# Patient Record
Sex: Male | Born: 1985 | Race: White | Hispanic: No | Smoking: Never smoker
Health system: Southern US, Community
[De-identification: ages and names within clinical notes are randomized; demographics above are authoritative.]

## PROBLEM LIST (undated history)

## (undated) DIAGNOSIS — J302 Other seasonal allergic rhinitis: Secondary | ICD-10-CM

## (undated) HISTORY — PX: NO PAST SURGERIES: SHX2092

## (undated) HISTORY — DX: Other seasonal allergic rhinitis: J30.2

---

## 2014-08-01 ENCOUNTER — Emergency Department (INDEPENDENT_AMBULATORY_CARE_PROVIDER_SITE_OTHER): Payer: 59

## 2014-08-01 ENCOUNTER — Encounter (HOSPITAL_COMMUNITY): Payer: Self-pay | Admitting: *Deleted

## 2014-08-01 ENCOUNTER — Emergency Department (INDEPENDENT_AMBULATORY_CARE_PROVIDER_SITE_OTHER)
Admission: EM | Admit: 2014-08-01 | Discharge: 2014-08-01 | Disposition: A | Discharge status: Home / Self Care | Payer: 59 | Source: Home / Self Care | Attending: Family Medicine | Admitting: Family Medicine

## 2014-08-01 DIAGNOSIS — R52 Pain, unspecified: Secondary | ICD-10-CM

## 2014-08-01 DIAGNOSIS — M79671 Pain in right foot: Secondary | ICD-10-CM

## 2014-08-01 MED ORDER — KETOROLAC TROMETHAMINE 30 MG/ML IJ SOLN
INTRAMUSCULAR | Status: AC
  Filled 2014-08-01: qty 1

## 2014-08-01 MED ORDER — KETOROLAC TROMETHAMINE 30 MG/ML IJ SOLN
30.0000 mg | Freq: Once | INTRAMUSCULAR | Status: AC
  Administered 2014-08-01: 30 mg via INTRAMUSCULAR

## 2014-08-01 NOTE — ED Provider Notes (Signed)
CSN: [id number]     Arrival date & time 08/01/14  1716 History   First MD Initiated Contact with Patient 08/01/14 1734     Chief Complaint  Patient presents with  . Foot Pain   (Consider location/radiation/quality/duration/timing/severity/associated sxs/prior Treatment)  HPI   Patient is a 28 year old male presenting today with complaints of right foot pain following running a full marathon this past Saturday. Patient states he runs 10-15 miles 2-3 times a week without difficulty. Patient admits to "not wearing the best shoes". States that his foot felt "okay" on Saturday, but he started to have lower foot pain causing him to "hobble" the ;past few days.  Denies any bruising or swelling.  No obvious injury.  Denies any medical history, takes no medications, no known drug allergies.  History reviewed. No pertinent past medical history. History reviewed. No pertinent past surgical history. History reviewed. No pertinent family history. History  Substance Use Topics  . Smoking status: Never Smoker   . Smokeless tobacco: Not on file  . Alcohol Use: Yes    Review of Systems  Constitutional: Negative.  Negative for fever and fatigue.  HENT: Negative.   Eyes: Negative.   Respiratory: Negative.   Cardiovascular: Negative.   Gastrointestinal: Negative.   Endocrine: Negative.   Genitourinary: Negative.   Musculoskeletal: Positive for gait problem.       R foot pain.  Skin: Negative.  Negative for color change and wound.  Allergic/Immunologic: Negative.   Neurological: Negative.  Negative for weakness and numbness.  Hematological: Negative.   Psychiatric/Behavioral: Negative.     Allergies  Review of patient's allergies indicates not on file.  Home Medications   Prior to Admission medications   Not on File   BP 131/81 mmHg  Pulse 60  Temp(Src) 98.1 F (36.7 C) (Oral)  Resp 12  SpO2 99%   Physical Exam  Constitutional: He is oriented to person, place, and time. He  appears well-developed and well-nourished. No distress.  Cardiovascular: Normal rate, regular rhythm, normal heart sounds and intact distal pulses.  Exam reveals no gallop and no friction rub.   No murmur heard. Pulmonary/Chest: Effort normal and breath sounds normal. No respiratory distress. He has no wheezes. He has no rales. He exhibits no tenderness.  Musculoskeletal: Normal range of motion. He exhibits no edema or tenderness.       Feet:  The patient is able to ambulate and bear weight however reports discomfort.  There is no obvious asymmetry or deformity of the right foot as compared to the left foot. No surface trauma, ecchymosis, erythema, lesions, ulcers or break in skin integrity. No bony step-off or deformity, no tenderness to palpation of his toes, midfoot or hindfoot or sole. Normal plantar/dorsiflexion, inversion/eversion. Distal motor neurovascular status is intact.  Neurological: He is alert and oriented to person, place, and time. He exhibits normal muscle tone. Coordination normal.  Skin: Skin is warm and dry. No rash noted. He is not diaphoretic. No erythema. No pallor.  Nursing note and vitals reviewed.  ED Course  Procedures (including critical care time) Labs Review Labs Reviewed - No data to display  Imaging Review Dg Foot Complete Right  08/01/2014   CLINICAL DATA:  Fifth metatarsal pain when walking, was running marathon 3 days ago  EXAM: RIGHT FOOT COMPLETE - 3+ VIEW  COMPARISON:  None.  FINDINGS: Three views of the right foot submitted. No acute fracture or subluxation. No radiopaque foreign body. No periosteal reaction or bony erosion.  IMPRESSION:  Negative.   Electronically Signed   By: Natasha MeadLiviu  Pop M.D.   On: 08/01/2014 18:34           MDM   1. Pain    Meds ordered this encounter  Medications  . ketorolac (TORADOL) 30 MG/ML injection 30 mg    Sig:    Differentials include overuse strain and stress fractures.  The patient to wear a cam walker boot  on the right foot for the next week. No weightbearing exercises. If symptoms worsen or fail to improve within the next week, the patient to follow up with Dr. Linna HoffNormal Regal with [facility].  The patient is to take Aleve over-the-counter twice daily in the morning twice in the evening for the next week.  The patient verbalizes understanding and agrees to plan of care.     Servando Salinaatherine H Rossi, NP 08/01/14 1853  Servando Salinaatherine H Rossi, NP 08/01/14 1858

## 2014-08-01 NOTE — ED Notes (Signed)
Pt            Reports  He       Ran  A  Marathon   sev  Days  Ago    And  Subsequently  developed  Pain    r  Foot    He  denys  A  specefic injury           He has  No  Obvious  Deformity

## 2014-08-01 NOTE — Discharge Instructions (Signed)
Foot Sprain The muscles and cord like structures which attach muscle to bone (tendons) that surround the feet are made up of units. A foot sprain can occur at the weakest spot in any of these units. This condition is most often caused by injury to or overuse of the foot, as from playing contact sports, or aggravating a previous injury, or from poor conditioning, or obesity. SYMPTOMS  Pain with movement of the foot.  Tenderness and swelling at the injury site.  Loss of strength is present in moderate or severe sprains. THE THREE GRADES OR SEVERITY OF FOOT SPRAIN ARE:  Mild (Grade I): Slightly pulled muscle without tearing of muscle or tendon fibers or loss of strength.  Moderate (Grade II): Tearing of fibers in a muscle, tendon, or at the attachment to bone, with small decrease in strength.  Severe (Grade III): Rupture of the muscle-tendon-bone attachment, with separation of fibers. Severe sprain requires surgical repair. Often repeating (chronic) sprains are caused by overuse. Sudden (acute) sprains are caused by direct injury or over-use. DIAGNOSIS  Diagnosis of this condition is usually by your own observation. If problems continue, a caregiver may be required for further evaluation and treatment. X-rays may be required to make sure there are not breaks in the bones (fractures) present. Continued problems may require physical therapy for treatment. PREVENTION  Use strength and conditioning exercises appropriate for your sport.  Warm up properly prior to working out.  Use athletic shoes that are made for the sport you are participating in.  Allow adequate time for healing. Early return to activities makes repeat injury more likely, and can lead to an unstable arthritic foot that can result in prolonged disability. Mild sprains generally heal in 3 to 10 days, with moderate and severe sprains taking 2 to 10 weeks. Your caregiver can help you determine the proper time required for  healing. HOME CARE INSTRUCTIONS   Apply ice to the injury for 15-20 minutes, 03-04 times per day. Put the ice in a plastic bag and place a towel between the bag of ice and your skin.  An elastic wrap (like an Ace bandage) may be used to keep swelling down.  Keep foot above the level of the heart, or at least raised on a footstool, when swelling and pain are present.  Try to avoid use other than gentle range of motion while the foot is painful. Do not resume use until instructed by your caregiver. Then begin use gradually, not increasing use to the point of pain. If pain does develop, decrease use and continue the above measures, gradually increasing activities that do not cause discomfort, until you gradually achieve normal use.  Use crutches if and as instructed, and for the length of time instructed.  Keep injured foot and ankle wrapped between treatments.  Massage foot and ankle for comfort and to keep swelling down. Massage from the toes up towards the knee.  Only take over-the-counter or prescription medicines for pain, discomfort, or fever as directed by your caregiver. SEEK IMMEDIATE MEDICAL CARE IF:   Your pain and swelling increase, or pain is not controlled with medications.  You have loss of feeling in your foot or your foot turns cold or blue.  You develop new, unexplained symptoms, or an increase of the symptoms that brought you to your caregiver. MAKE SURE YOU:   Understand these instructions.  Will watch your condition.  Will get help right away if you are not doing well or get worse. Document Released:   02/28/2002 Document Revised: 12/01/2011 Document Reviewed: 04/27/2008 [employer] Patient Information 2015 [employer]. This information is not intended to replace advice given to you by your health care provider. Make sure you discuss any questions you have with your health care provider.  

## 2014-08-30 ENCOUNTER — Ambulatory Visit (INDEPENDENT_AMBULATORY_CARE_PROVIDER_SITE_OTHER): Payer: 59 | Admitting: Family Medicine

## 2014-08-30 ENCOUNTER — Encounter: Payer: Self-pay | Admitting: Family Medicine

## 2014-08-30 VITALS — BP 124/84 | HR 60 | Temp 97.8°F | Ht 73.0 in | Wt 212.8 lb

## 2014-08-30 DIAGNOSIS — B360 Pityriasis versicolor: Secondary | ICD-10-CM | POA: Insufficient documentation

## 2014-08-30 DIAGNOSIS — R1031 Right lower quadrant pain: Secondary | ICD-10-CM | POA: Insufficient documentation

## 2014-08-30 DIAGNOSIS — M940 Chondrocostal junction syndrome [Tietze]: Secondary | ICD-10-CM | POA: Insufficient documentation

## 2014-08-30 DIAGNOSIS — Z0000 Encounter for general adult medical examination without abnormal findings: Secondary | ICD-10-CM | POA: Insufficient documentation

## 2014-08-30 LAB — LIPID PANEL
CHOLESTEROL: 199 mg/dL (ref 0–200)
HDL: 51.7 mg/dL (ref >39.00)
LDL CALC: 138 mg/dL — AB (ref 0–99)
NonHDL: 147.3
TRIGLYCERIDES: 48 mg/dL (ref 0.0–149.0)
Total CHOL/HDL Ratio: 4
VLDL: 9.6 mg/dL (ref 0.0–40.0)

## 2014-08-30 LAB — COMPREHENSIVE METABOLIC PANEL
ALBUMIN: 4.6 g/dL (ref 3.5–5.2)
ALK PHOS: 50 U/L (ref 39–117)
ALT: 25 U/L (ref 0–53)
AST: 37 U/L (ref 0–37)
BILIRUBIN TOTAL: 1.4 mg/dL — AB (ref 0.2–1.2)
BUN: 18 mg/dL (ref 6–23)
CO2: 30 mEq/L (ref 19–32)
Calcium: 9 mg/dL (ref 8.4–10.5)
Chloride: 99 mEq/L (ref 96–112)
Creatinine, Ser: 1.1 mg/dL (ref 0.4–1.5)
GFR: 87.27 mL/min (ref >60.00)
Glucose, Bld: 87 mg/dL (ref 70–99)
Potassium: 4 mEq/L (ref 3.5–5.1)
Sodium: 135 mEq/L (ref 135–145)
Total Protein: 7.9 g/dL (ref 6.0–8.3)

## 2014-08-30 MED ORDER — FLUCONAZOLE 150 MG PO TABS: 300.0000 mg | ORAL_TABLET | ORAL | Status: DC

## 2014-08-30 NOTE — Progress Notes (Signed)
BP 124/84 mmHg  Pulse 60  Temp(Src) 97.8 F (36.6 C) (Oral)  Ht 6\' 1"  (1.854 m)  Wt 212 lb 12 oz (96.503 kg)  BMI 28.08 kg/m2   CC: new pt to establish, would like CPE Subjective:    Patient ID: Aaron Pugh, male    DOB: 01-23-1986, 28 y.o.   MRN: [id number]  HPI: Aaron RidgeSean Stormes is a 28 y.o. male presenting on 08/30/2014 for Establish Care   Would like CPE today.  [place]  Seasonal allergies controlled with zyrtec.  Intermittent RLQ discomfort over last 2-3 years. May have started after heavy lifting. Ran marathon last month, and more noticeable during training. Stays fit. Has started participating crossfit.  After marathon had ankle sprain - [facility]. Now resolved.  Spots on shoulder worse 2 yrs ago, treated with OTC antifungal.  Preventative: No recent CPE Flu shot - at work Tetanus - 2006 Seat belt use discussed Sunscreen use discussed - doesn't wear. Would like skin lesions on shoulder checked. Declines STD screen today.  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 Interim medical history since our last visit reviewed. Allergies and medications reviewed and updated.  No current outpatient prescriptions on file prior to visit.   No current facility-administered medications on file prior to visit.    Review of Systems  Constitutional: Negative for fever, chills, activity change, appetite change, fatigue and unexpected weight change.  HENT: Negative for hearing loss.   Eyes: Negative for visual disturbance.  Respiratory: Negative for cough, chest tightness, shortness of breath and wheezing.   Cardiovascular: Negative for chest pain, palpitations and leg  swelling.  Gastrointestinal: Positive for abdominal pain (RLQ discomfort). Negative for nausea, vomiting, diarrhea, constipation, blood in stool and abdominal distention.  Genitourinary: Negative for dysuria, hematuria and difficulty urinating.  Musculoskeletal: Negative for myalgias, arthralgias and neck pain.       Chest wall pain  Skin: Negative for rash.  Neurological: Negative for dizziness, seizures, syncope and headaches.  Hematological: Negative for adenopathy. Does not bruise/bleed easily.  Psychiatric/Behavioral: Negative for dysphoric mood. The patient is not nervous/anxious.    Per HPI unless specifically indicated above     Objective:    BP 124/84 mmHg  Pulse 60  Temp(Src) 97.8 F (36.6 C) (Oral)  Ht 6\' 1"  (1.854 m)  Wt 212 lb 12 oz (96.503 kg)  BMI 28.08 kg/m2  Wt Readings from Last 3 Encounters:  08/30/14 212 lb 12 oz (96.503 kg)    Physical Exam  Constitutional: He is oriented to person, place, and time. He appears well-developed and well-nourished. No distress.  Muscular build  HENT:  Head: Normocephalic and atraumatic.  Right Ear: Hearing, tympanic membrane, external ear and ear canal normal.  Left Ear: Hearing, tympanic membrane, external ear and ear canal normal.  Nose: Nose normal.  Mouth/Throat: Uvula is midline, oropharynx is clear and moist and mucous membranes are normal. No oropharyngeal exudate, posterior oropharyngeal edema or posterior oropharyngeal erythema.  Eyes: Conjunctivae and EOM are normal. Pupils are equal, round, and reactive to light. No scleral icterus.  Neck: Normal range of motion. Neck supple. No thyromegaly present.  Cardiovascular: Normal rate, regular rhythm, normal heart sounds and intact distal pulses.  No murmur heard. Pulses:      Radial pulses are 2+ on the right side, and 2+ on the left side.  Pulmonary/Chest: Effort normal and breath sounds normal. No respiratory distress. He has no wheezes. He has no rales. He exhibits  tenderness (R 2nd intercostal tenderness to palpation.).  Abdominal: Soft. Normal appearance and bowel sounds are normal. He exhibits no distension and no mass. There is no hepatosplenomegaly. There is no tenderness. There is no rigidity, no rebound, no guarding and negative Murphy's sign. No hernia.  No obvious hernia noted today but there is slight bulge on right with cough compared to left side  Musculoskeletal: Normal range of motion. He exhibits no edema.  Lymphadenopathy:    He has no cervical adenopathy.  Neurological: He is alert and oriented to person, place, and time.  CN grossly intact, station and gait intact  Skin: Skin is warm and dry. Rash noted.  macular rash on upper back and posterior shoulders  Psychiatric: He has a normal mood and affect. His behavior is normal. Judgment and thought content normal.  Nursing note and vitals reviewed.  No results found for this or any previous visit.    Assessment & Plan:   Problem List Items Addressed This Visit    Tinea versicolor    Treat with diflucan course and discussed nizoral or selsun blue preventative.    RLQ discomfort    No obvious hernia on exam today. ?sportsman hernia. Discussed straining and avoiding significant heavy lifting.    Health maintenance examination - Primary    Preventative protocols reviewed and updated unless pt declined. Discussed healthy diet and lifestyle.     Relevant Orders      Lipid panel      Comprehensive metabolic panel   Costochondritis    Discussed dx and managment.        Follow up plan: Return in about 1 year (around 08/31/2015), or as needed, for annual exam, prior fasting for blood work.

## 2014-08-30 NOTE — Assessment & Plan Note (Signed)
Discussed dx and managment.

## 2014-08-30 NOTE — Assessment & Plan Note (Signed)
No obvious hernia on exam today. ?sportsman hernia. Discussed straining and avoiding significant heavy lifting.

## 2014-08-30 NOTE — Assessment & Plan Note (Signed)
Preventative protocols reviewed and updated unless pt declined. Discussed healthy diet and lifestyle.  

## 2014-08-30 NOTE — Assessment & Plan Note (Signed)
Treat with diflucan course and discussed nizoral or selsun blue preventative.

## 2014-08-30 NOTE — Progress Notes (Signed)
Pre visit review using our clinic review tool, if applicable. No additional management support is needed unless otherwise documented below in the visit note. 

## 2014-08-30 NOTE — Patient Instructions (Addendum)
Good to meet you today, call us with questions. Blood work today. For costochondritis - may use ibuprofen as needed. For back - may use antifungal for 2 wk course. Sent to pharmacy. Afterwards use selsun blue or nizoral shampoo once a week on affected skin to prevent recurrence.

## 2015-05-16 ENCOUNTER — Encounter: Payer: Self-pay | Admitting: Family Medicine

## 2015-05-16 ENCOUNTER — Ambulatory Visit (INDEPENDENT_AMBULATORY_CARE_PROVIDER_SITE_OTHER): Payer: Managed Care, Other (non HMO) | Admitting: Family Medicine

## 2015-05-16 VITALS — BP 104/68 | HR 86 | Temp 97.9°F | Wt 213.0 lb

## 2015-05-16 DIAGNOSIS — Z23 Encounter for immunization: Secondary | ICD-10-CM

## 2015-05-16 DIAGNOSIS — Z0000 Encounter for general adult medical examination without abnormal findings: Secondary | ICD-10-CM | POA: Diagnosis not present

## 2015-05-16 DIAGNOSIS — B360 Pityriasis versicolor: Secondary | ICD-10-CM

## 2015-05-16 LAB — LIPID PANEL
CHOLESTEROL: 185 mg/dL (ref 0–200)
HDL: 55.3 mg/dL (ref >39.00)
LDL CALC: 113 mg/dL — AB (ref 0–99)
NonHDL: 129.41
TRIGLYCERIDES: 82 mg/dL (ref 0.0–149.0)
Total CHOL/HDL Ratio: 3
VLDL: 16.4 mg/dL (ref 0.0–40.0)

## 2015-05-16 LAB — BASIC METABOLIC PANEL
BUN: 12 mg/dL (ref 6–23)
CALCIUM: 9.7 mg/dL (ref 8.4–10.5)
CO2: 32 mEq/L (ref 19–32)
Chloride: 102 mEq/L (ref 96–112)
Creatinine, Ser: 1.04 mg/dL (ref 0.40–1.50)
GFR: 89.74 mL/min (ref >60.00)
GLUCOSE: 93 mg/dL (ref 70–99)
Potassium: 4.8 mEq/L (ref 3.5–5.1)
Sodium: 139 mEq/L (ref 135–145)

## 2015-05-16 NOTE — Patient Instructions (Addendum)
Tdap today. Good to see you today, call us with questions. labwork today - we will let you know results.   Health Maintenance A healthy lifestyle and preventative care can promote health and wellness.  Maintain regular health, dental, and eye exams.  Eat a healthy diet. Foods like vegetables, fruits, whole grains, low-fat dairy products, and lean protein foods contain the nutrients you need and are low in calories. Decrease your intake of foods high in solid fats, added sugars, and salt. Get information about a proper diet from your health care provider, if necessary.  Regular physical exercise is one of the most important things you can do for your health. Most adults should get at least 150 minutes of moderate-intensity exercise (any activity that increases your heart rate and causes you to sweat) each week. In addition, most adults need muscle-strengthening exercises on 2 or more days a week.   Maintain a healthy weight. The body mass index (BMI) is a screening tool to identify possible weight problems. It provides an estimate of body fat based on height and weight. Your health care provider can find your BMI and can help you achieve or maintain a healthy weight. For males 20 years and older:  A BMI below 18.5 is considered underweight.  A BMI of 18.5 to 24.9 is normal.  A BMI of 25 to 29.9 is considered overweight.  A BMI of 30 and above is considered obese.  Maintain normal blood lipids and cholesterol by exercising and minimizing your intake of saturated fat. Eat a balanced diet with plenty of fruits and vegetables. Blood tests for lipids and cholesterol should begin at age 39 and be repeated every 5 years. If your lipid or cholesterol levels are high, you are over age 1, or you are at high risk for heart disease, you may need your cholesterol levels checked more frequently.Ongoing high lipid and cholesterol levels should be treated with medicines if diet and exercise are not  working.  If you smoke, find out from your health care provider how to quit. If you do not use tobacco, do not start.  Lung cancer screening is recommended for adults aged 55-80 years who are at high risk for developing lung cancer because of a history of smoking. A yearly low-dose CT scan of the lungs is recommended for people who have at least a 30-pack-year history of smoking and are current smokers or have quit within the past 15 years. A pack year of smoking is smoking an average of 1 pack of cigarettes a day for 1 year (for example, a 30-pack-year history of smoking could mean smoking 1 pack a day for 30 years or 2 packs a day for 15 years). Yearly screening should continue until the smoker has stopped smoking for at least 15 years. Yearly screening should be stopped for people who develop a health problem that would prevent them from having lung cancer treatment.  If you choose to drink alcohol, do not have more than 2 drinks per day. One drink is considered to be 12 oz (360 mL) of beer, 5 oz (150 mL) of wine, or 1.5 oz (45 mL) of liquor.  Avoid the use of street drugs. Do not share needles with anyone. Ask for help if you need support or instructions about stopping the use of drugs.  High blood pressure causes heart disease and increases the risk of stroke. Blood pressure should be checked at least every 1-2 years. Ongoing high blood pressure should be treated with  medicines if weight loss and exercise are not effective.  If you are 19-71 years old, ask your health care provider if you should take aspirin to prevent heart disease.  Diabetes screening involves taking a blood sample to check your fasting blood sugar level. This should be done once every 3 years after age 25 if you are at a normal weight and without risk factors for diabetes. Testing should be considered at a younger age or be carried out more frequently if you are overweight and have at least 1 risk factor for  diabetes.  Colorectal cancer can be detected and often prevented. Most routine colorectal cancer screening begins at the age of 38 and continues through age 74. However, your health care provider may recommend screening at an earlier age if you have risk factors for colon cancer. On a yearly basis, your health care provider may provide home test kits to check for hidden blood in the stool. A small camera at the end of a tube may be used to directly examine the colon (sigmoidoscopy or colonoscopy) to detect the earliest forms of colorectal cancer. Talk to your health care provider about this at age 77 when routine screening begins. A direct exam of the colon should be repeated every 5-10 years through age 70, unless early forms of precancerous polyps or small growths are found.  People who are at an increased risk for hepatitis B should be screened for this virus. You are considered at high risk for hepatitis B if:  You were born in a country where hepatitis B occurs often. Talk with your health care provider about which countries are considered high risk.  Your parents were born in a high-risk country and you have not received a shot to protect against hepatitis B (hepatitis B vaccine).  You have HIV or AIDS.  You use needles to inject street drugs.  You live with, or have sex with, someone who has hepatitis B.  You are a man who has sex with other men (MSM).  You get hemodialysis treatment.  You take certain medicines for conditions like cancer, organ transplantation, and autoimmune conditions.  Hepatitis C blood testing is recommended for all people born from [birth year] through 1965 and any individual with known risk factors for hepatitis C.  Healthy men should no longer receive prostate-specific antigen (PSA) blood tests as part of routine cancer screening. Talk to your health care provider about prostate cancer screening.  Testicular cancer screening is not recommended for adolescents or  adult males who have no symptoms. Screening includes self-exam, a health care provider exam, and other screening tests. Consult with your health care provider about any symptoms you have or any concerns you have about testicular cancer.  Practice safe sex. Use condoms and avoid high-risk sexual practices to reduce the spread of sexually transmitted infections (STIs).  You should be screened for STIs, including gonorrhea and chlamydia if:  You are sexually active and are younger than 24 years.  You are older than 24 years, and your health care provider tells you that you are at risk for this type of infection.  Your sexual activity has changed since you were last screened, and you are at an increased risk for chlamydia or gonorrhea. Ask your health care provider if you are at risk.  If you are at risk of being infected with HIV, it is recommended that you take a prescription medicine daily to prevent HIV infection. This is called pre-exposure prophylaxis (PrEP). You are considered  at risk if:  You are a man who has sex with other men (MSM).  You are a heterosexual man who is sexually active with multiple partners.  You take drugs by injection.  You are sexually active with a partner who has HIV.  Talk with your health care provider about whether you are at high risk of being infected with HIV. If you choose to begin PrEP, you should first be tested for HIV. You should then be tested every 3 months for as long as you are taking PrEP.  Use sunscreen. Apply sunscreen liberally and repeatedly throughout the day. You should seek shade when your shadow is shorter than you. Protect yourself by wearing long sleeves, pants, a wide-brimmed hat, and sunglasses year round whenever you are outdoors.  Tell your health care provider of new moles or changes in moles, especially if there is a change in shape or color. Also, tell your health care provider if a mole is larger than the size of a pencil  eraser.  A one-time screening for abdominal aortic aneurysm (AAA) and surgical repair of large AAAs by ultrasound is recommended for men aged 38-75 years who are current or former smokers.  Stay current with your vaccines (immunizations). Document Released: 03/06/2008 Document Revised: 09/13/2013 Document Reviewed: 02/03/2011 [facility] Patient Information 2015 [place], [place]. This information is not intended to replace advice given to you by your health care provider. Make sure you discuss any questions you have with your health care provider.

## 2015-05-16 NOTE — Addendum Note (Signed)
Addended byCleda Clarks B on: 05/16/2015 09:52 AM   Modules accepted: Orders

## 2015-05-16 NOTE — Progress Notes (Signed)
BP 104/68 mmHg  Pulse 86  Temp(Src) 97.9 F (36.6 C) (Oral)  Wt 213 lb (96.616 kg)  SpO2 99%   CC: biometric screening  Subjective:    Patient ID: Aaron Pugh, male    DOB: 04-07-1986, 29 y.o.   MRN: [id number]  HPI: Aaron Pugh is a 29 y.o. male presenting on 05/16/2015 for Acute Visit   Wife pregnant.  New job - [employer] - 6wks ago. Last CPE 08/2014. Labs don't qualify for work form as done 08/2014 and needs labs since 09/2014. Needs new CPE today.  Tinea versicolor - diflucan helped. Has not been regular with antifungal shampoo.  Preventative: Flu shot - at work Tetanus - 2006. Tdap today. Seat belt use discussed Sunscreen use discussed - doesn't wear. Would like skin lesions on shoulder checked.   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 Interim medical history since our last visit reviewed. Allergies and medications reviewed and updated. Current Outpatient Prescriptions on File Prior to Visit  Medication Sig  . fluconazole (DIFLUCAN) 150 MG tablet Take 2 tablets (300 mg total) by mouth once a week. (Patient not taking: Reported on 05/16/2015)   No current facility-administered medications on file prior to visit.    Review of Systems  Constitutional: Negative for fever, chills, activity change, appetite change, fatigue and unexpected weight change.  HENT: Negative for hearing loss.   Eyes: Negative for visual disturbance.  Respiratory: Negative for cough, chest tightness, shortness of breath and wheezing.   Cardiovascular: Negative for chest pain, palpitations and leg swelling.  Gastrointestinal: Negative for nausea, vomiting, abdominal pain, diarrhea, constipation, blood in stool and abdominal distention.    Genitourinary: Negative for hematuria and difficulty urinating.  Musculoskeletal: Negative for myalgias, arthralgias and neck pain.  Skin: Negative for rash.  Neurological: Negative for dizziness, seizures, syncope and headaches.  Hematological: Negative for adenopathy. Does not bruise/bleed easily.  Psychiatric/Behavioral: Negative for dysphoric mood. The patient is not nervous/anxious.    Per HPI unless specifically indicated above     Objective:    BP 104/68 mmHg  Pulse 86  Temp(Src) 97.9 F (36.6 C) (Oral)  Wt 213 lb (96.616 kg)  SpO2 99%  Wt Readings from Last 3 Encounters:  05/16/15 213 lb (96.616 kg)  08/30/14 212 lb 12 oz (96.503 kg)   Body mass index is 28.11 kg/(m^2).   Physical Exam  Constitutional: He is oriented to person, place, and time. He appears well-developed and well-nourished. No distress.  muscular  HENT:  Head: Normocephalic and atraumatic.  Right Ear: Hearing, tympanic membrane, external ear and ear canal normal.  Left Ear: Hearing, tympanic membrane, external ear and ear canal normal.  Nose: Nose normal.  Mouth/Throat: Uvula is midline, oropharynx is clear and moist and mucous membranes are normal. No oropharyngeal exudate, posterior oropharyngeal edema or posterior oropharyngeal erythema.  Eyes: Conjunctivae and EOM are normal. Pupils are equal, round, and reactive to light. No scleral icterus.  Neck: Normal range of motion. Neck supple. No thyromegaly present.  Cardiovascular: Normal rate, regular rhythm, normal heart sounds and intact distal pulses.   No murmur heard. Pulses:      Radial pulses are 2+ on the right side, and 2+ on the left side.  Pulmonary/Chest: Effort normal and breath sounds normal. No respiratory distress.  He has no wheezes. He has no rales.  Abdominal: Soft. Bowel sounds are normal. He exhibits no distension and no mass. There is no tenderness. There is no rebound and no guarding.  Musculoskeletal: Normal range of motion. He  exhibits no edema.  Lymphadenopathy:    He has no cervical adenopathy.  Neurological: He is alert and oriented to person, place, and time.  CN grossly intact, station and gait intact  Skin: Skin is warm and dry. Rash noted.  Macular hypopigmented rash on back.  Psychiatric: He has a normal mood and affect. His behavior is normal. Judgment and thought content normal.  Nursing note and vitals reviewed.  Results for orders placed or performed in visit on 08/30/14  Lipid panel  Result Value Ref Range   Cholesterol 199 0 - 200 mg/dL   Triglycerides 16.1 0.0 - 149.0 mg/dL   HDL 09.60 >45.40 mg/dL   VLDL 9.6 0.0 - 98.1 mg/dL   LDL Cholesterol 191 (H) 0 - 99 mg/dL   Total CHOL/HDL Ratio 4    NonHDL 147.30   Comprehensive metabolic panel  Result Value Ref Range   Sodium 135 135 - 145 mEq/L   Potassium 4.0 3.5 - 5.1 mEq/L   Chloride 99 96 - 112 mEq/L   CO2 30 19 - 32 mEq/L   Glucose, Bld 87 70 - 99 mg/dL   BUN 18 6 - 23 mg/dL   Creatinine, Ser 1.1 0.4 - 1.5 mg/dL   Total Bilirubin 1.4 (H) 0.2 - 1.2 mg/dL   Alkaline Phosphatase 50 39 - 117 U/L   AST 37 0 - 37 U/L   ALT 25 0 - 53 U/L   Total Protein 7.9 6.0 - 8.3 g/dL   Albumin 4.6 3.5 - 5.2 g/dL   Calcium 9.0 8.4 - 47.8 mg/dL   GFR 29.56 >21.30 mL/min      Assessment & Plan:   Problem List Items Addressed This Visit    Health maintenance examination - Primary    Preventative protocols reviewed and updated unless pt declined. Discussed healthy diet and lifestyle.  Update labs for work.      Relevant Orders   Lipid panel   Basic metabolic panel   Tinea versicolor    Diflucan helped, has not been regular with antifungal shampoo for maintenance. Will try this, update if desires refill of oral antifungal in the future.          Follow up plan: Return in about 1 year (around 05/15/2016) for annual exam, prior fasting for blood work.

## 2015-05-16 NOTE — Assessment & Plan Note (Signed)
Preventative protocols reviewed and updated unless pt declined. Discussed healthy diet and lifestyle.  Update labs for work.

## 2015-05-16 NOTE — Progress Notes (Signed)
Pre visit review using our clinic review tool, if applicable. No additional management support is needed unless otherwise documented below in the visit note. 

## 2015-05-16 NOTE — Assessment & Plan Note (Signed)
Diflucan helped, has not been regular with antifungal shampoo for maintenance. Will try this, update if desires refill of oral antifungal in the future.

## 2015-08-31 ENCOUNTER — Other Ambulatory Visit: Payer: 59

## 2015-09-03 ENCOUNTER — Encounter: Payer: 59 | Admitting: Family Medicine

## 2016-05-19 ENCOUNTER — Ambulatory Visit (INDEPENDENT_AMBULATORY_CARE_PROVIDER_SITE_OTHER): Payer: Managed Care, Other (non HMO) | Admitting: Family Medicine

## 2016-05-19 ENCOUNTER — Encounter: Payer: Self-pay | Admitting: Family Medicine

## 2016-05-19 VITALS — BP 116/72 | HR 68 | Temp 97.6°F | Ht 73.0 in | Wt 219.5 lb

## 2016-05-19 DIAGNOSIS — Z131 Encounter for screening for diabetes mellitus: Secondary | ICD-10-CM

## 2016-05-19 DIAGNOSIS — Z13220 Encounter for screening for lipoid disorders: Secondary | ICD-10-CM

## 2016-05-19 DIAGNOSIS — Z23 Encounter for immunization: Secondary | ICD-10-CM

## 2016-05-19 DIAGNOSIS — Z0000 Encounter for general adult medical examination without abnormal findings: Secondary | ICD-10-CM | POA: Diagnosis not present

## 2016-05-19 DIAGNOSIS — L84 Corns and callosities: Secondary | ICD-10-CM

## 2016-05-19 DIAGNOSIS — B360 Pityriasis versicolor: Secondary | ICD-10-CM

## 2016-05-19 LAB — BASIC METABOLIC PANEL
BUN: 17 mg/dL (ref 6–23)
CALCIUM: 9.1 mg/dL (ref 8.4–10.5)
CO2: 32 mEq/L (ref 19–32)
Chloride: 102 mEq/L (ref 96–112)
Creatinine, Ser: 1.09 mg/dL (ref 0.40–1.50)
GFR: 84.41 mL/min (ref >60.00)
GLUCOSE: 89 mg/dL (ref 70–99)
POTASSIUM: 4.3 meq/L (ref 3.5–5.1)
Sodium: 137 mEq/L (ref 135–145)

## 2016-05-19 LAB — LIPID PANEL
Cholesterol: 172 mg/dL (ref 0–200)
HDL: 52.1 mg/dL (ref >39.00)
LDL CALC: 106 mg/dL — AB (ref 0–99)
NonHDL: 120.2
TRIGLYCERIDES: 69 mg/dL (ref 0.0–149.0)
Total CHOL/HDL Ratio: 3
VLDL: 13.8 mg/dL (ref 0.0–40.0)

## 2016-05-19 NOTE — Assessment & Plan Note (Signed)
Improved with home regimen of lotrimin regularly.

## 2016-05-19 NOTE — Assessment & Plan Note (Addendum)
Preventative protocols reviewed and updated unless pt declined. Discussed healthy diet and lifestyle. Discussed simple vs complex carbs

## 2016-05-19 NOTE — Progress Notes (Signed)
BP 116/72   Pulse 68   Temp 97.6 F (36.4 C) (Oral)   Ht 6\' 1"  (1.854 m)   Wt 219 lb 8 oz (99.6 kg)   BMI 28.96 kg/m    CC: CPE Subjective:    Patient ID: Aaron Pugh, male    DOB: 06-May-1986, 30 y.o.   MRN: [id number]  HPI: Aaron Pugh is a 30 y.o. male presenting on 05/19/2016 for Annual Exam   Tinea versicolor - has been treating with OTC lamisil, tea tree oil, and lotrimin. This helped. Persistent rough patch at area where barbell lays when doing back squats.   Preventative: Flu shot today Tetanus - 2006. Tdap 2016 Seat belt use discussed Sunscreen use discussed - doesn't wear. No changing moles on skin. 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 Interim medical history since our last visit reviewed. Allergies and medications reviewed and updated. No current outpatient prescriptions on file prior to visit.   No current facility-administered medications on file prior to visit.     Review of Systems  Constitutional: Negative for activity change, appetite change, chills, fatigue, fever and unexpected weight change.  HENT: Negative for hearing loss.   Eyes: Negative for visual disturbance.  Respiratory: Negative for cough, chest tightness, shortness of breath and wheezing.   Cardiovascular: Negative for chest pain, palpitations and leg swelling.  Gastrointestinal: Negative for abdominal distention, abdominal pain, blood in stool, constipation, diarrhea, nausea and vomiting.  Genitourinary: Negative for difficulty urinating and hematuria.  Musculoskeletal: Negative for arthralgias, myalgias and neck pain.  Skin: Positive for rash.  Neurological: Negative  for dizziness, seizures, syncope and headaches.  Hematological: Negative for adenopathy. Does not bruise/bleed easily.  Psychiatric/Behavioral: Negative for dysphoric mood. The patient is not nervous/anxious.    Per HPI unless specifically indicated in ROS section     Objective:    BP 116/72   Pulse 68   Temp 97.6 F (36.4 C) (Oral)   Ht 6\' 1"  (1.854 m)   Wt 219 lb 8 oz (99.6 kg)   BMI 28.96 kg/m   Wt Readings from Last 3 Encounters:  05/19/16 219 lb 8 oz (99.6 kg)  05/16/15 213 lb (96.6 kg)  08/30/14 212 lb 12 oz (96.5 kg)    Physical Exam  Constitutional: He is oriented to person, place, and time. He appears well-developed and well-nourished. No distress.  HENT:  Head: Normocephalic and atraumatic.  Right Ear: Hearing, tympanic membrane, external ear and ear canal normal.  Left Ear: Hearing, tympanic membrane, external ear and ear canal normal.  Nose: Nose normal.  Mouth/Throat: Uvula is midline, oropharynx is clear and moist and mucous membranes are normal. No oropharyngeal exudate, posterior oropharyngeal edema or posterior oropharyngeal erythema.  Eyes: Conjunctivae and EOM are normal. Pupils are equal, round, and reactive to light. No scleral icterus.  Neck: Normal range of motion. Neck supple.  Cardiovascular: Normal rate, regular rhythm, normal heart sounds and intact distal pulses.   No murmur heard. Pulses:      Radial pulses are 2+ on the right side, and 2+ on the left side.  Pulmonary/Chest: Effort normal and breath sounds normal. No respiratory distress. He has no wheezes. He has no rales.  Abdominal: Soft.  Bowel sounds are normal. He exhibits no distension and no mass. There is no tenderness. There is no rebound and no guarding.  Musculoskeletal: Normal range of motion. He exhibits no edema.  Lymphadenopathy:    He has no cervical adenopathy.  Neurological: He is alert and oriented to person, place, and time.  CN grossly intact, station and gait intact  Skin:  Skin is warm and dry. Rash noted.  Rough patch on upper mid back at upper thoracic spine  Psychiatric: He has a normal mood and affect. His behavior is normal. Judgment and thought content normal.  Nursing note and vitals reviewed.  Results for orders placed or performed in visit on 05/16/15  Lipid panel  Result Value Ref Range   Cholesterol 185 0 - 200 mg/dL   Triglycerides 40.982.0 0.0 - 149.0 mg/dL   HDL 81.1955.30 >14.78>39.00 mg/dL   VLDL 29.516.4 0.0 - 62.140.0 mg/dL   LDL Cholesterol 308113 (H) 0 - 99 mg/dL   Total CHOL/HDL Ratio 3    NonHDL 129.41   Basic metabolic panel  Result Value Ref Range   Sodium 139 135 - 145 mEq/L   Potassium 4.8 3.5 - 5.1 mEq/L   Chloride 102 96 - 112 mEq/L   CO2 32 19 - 32 mEq/L   Glucose, Bld 93 70 - 99 mg/dL   BUN 12 6 - 23 mg/dL   Creatinine, Ser 6.571.04 0.40 - 1.50 mg/dL   Calcium 9.7 8.4 - 84.610.5 mg/dL   GFR 96.2989.74 >52.84>60.00 mL/min      Assessment & Plan:   Problem List Items Addressed This Visit    Health maintenance examination - Primary    Preventative protocols reviewed and updated unless pt declined. Discussed healthy diet and lifestyle. Discussed simple vs complex carbs      Skin callus    Area where barbell lays on back when doing back squats. Pt not bothered by this.       Tinea versicolor    Improved with home regimen of lotrimin regularly.       Other Visit Diagnoses    Need for influenza vaccination       Relevant Orders   Flu Vaccine QUAD 36+ mos PF IM (Fluarix & Fluzone Quad PF) (Completed)   Lipid screening       Relevant Orders   Lipid panel   Diabetes mellitus screening       Relevant Orders   Basic metabolic panel       Follow up plan: Return in about 1 year (around 05/19/2017), or as needed, for annual exam, prior fasting for blood work.  Aaron BoydenJavier Avamarie Crossley, MD

## 2016-05-19 NOTE — Assessment & Plan Note (Addendum)
Area where barbell lays on back when doing back squats. Pt not bothered by this.

## 2016-05-19 NOTE — Patient Instructions (Signed)
Flu shot today Labs today. You are doing well. Return as needed or in 1 yr for next physical.  Health Maintenance, Male A healthy lifestyle and preventative care can promote health and wellness.  Maintain regular health, dental, and eye exams.  Eat a healthy diet. Foods like vegetables, fruits, whole grains, low-fat dairy products, and lean protein foods contain the nutrients you need and are low in calories. Decrease your intake of foods high in solid fats, added sugars, and salt. Get information about a proper diet from your health care provider, if necessary.  Regular physical exercise is one of the most important things you can do for your health. Most adults should get at least 150 minutes of moderate-intensity exercise (any activity that increases your heart rate and causes you to sweat) each week. In addition, most adults need muscle-strengthening exercises on 2 or more days a week.   Maintain a healthy weight. The body mass index (BMI) is a screening tool to identify possible weight problems. It provides an estimate of body fat based on height and weight. Your health care provider can find your BMI and can help you achieve or maintain a healthy weight. For males 20 years and older:  A BMI below 18.5 is considered underweight.  A BMI of 18.5 to 24.9 is normal.  A BMI of 25 to 29.9 is considered overweight.  A BMI of 30 and above is considered obese.  Maintain normal blood lipids and cholesterol by exercising and minimizing your intake of saturated fat. Eat a balanced diet with plenty of fruits and vegetables. Blood tests for lipids and cholesterol should begin at age 30 and be repeated every 5 years. If your lipid or cholesterol levels are high, you are over age 30, or you are at high risk for heart disease, you may need your cholesterol levels checked more frequently.Ongoing high lipid and cholesterol levels should be treated with medicines if diet and exercise are not working.  If  you smoke, find out from your health care provider how to quit. If you do not use tobacco, do not start.  Lung cancer screening is recommended for adults aged 55-80 years who are at high risk for developing lung cancer because of a history of smoking. A yearly low-dose CT scan of the lungs is recommended for people who have at least a 30-pack-year history of smoking and are current smokers or have quit within the past 15 years. A pack year of smoking is smoking an average of 1 pack of cigarettes a day for 1 year (for example, a 30-pack-year history of smoking could mean smoking 1 pack a day for 30 years or 2 packs a day for 15 years). Yearly screening should continue until the smoker has stopped smoking for at least 15 years. Yearly screening should be stopped for people who develop a health problem that would prevent them from having lung cancer treatment.  If you choose to drink alcohol, do not have more than 2 drinks per day. One drink is considered to be 12 oz (360 mL) of beer, 5 oz (150 mL) of wine, or 1.5 oz (45 mL) of liquor.  Avoid the use of street drugs. Do not share needles with anyone. Ask for help if you need support or instructions about stopping the use of drugs.  High blood pressure causes heart disease and increases the risk of stroke. High blood pressure is more likely to develop in:  People who have blood pressure in the end of  the normal range (100-139/85-89 mm Hg).  People who are overweight or obese.  People who are African American.  If you are 54-39 years of age, have your blood pressure checked every 3-5 years. If you are 54 years of age or older, have your blood pressure checked every year. You should have your blood pressure measured twice--once when you are at a hospital or clinic, and once when you are not at a hospital or clinic. Record the average of the two measurements. To check your blood pressure when you are not at a hospital or clinic, you can use:  An automated  blood pressure machine at a pharmacy.  A home blood pressure monitor.  If you are 45-99 years old, ask your health care provider if you should take aspirin to prevent heart disease.  Diabetes screening involves taking a blood sample to check your fasting blood sugar level. This should be done once every 3 years after age 40 if you are at a normal weight and without risk factors for diabetes. Testing should be considered at a younger age or be carried out more frequently if you are overweight and have at least 1 risk factor for diabetes.  Colorectal cancer can be detected and often prevented. Most routine colorectal cancer screening begins at the age of 47 and continues through age 1. However, your health care provider may recommend screening at an earlier age if you have risk factors for colon cancer. On a yearly basis, your health care provider may provide home test kits to check for hidden blood in the stool. A small camera at the end of a tube may be used to directly examine the colon (sigmoidoscopy or colonoscopy) to detect the earliest forms of colorectal cancer. Talk to your health care provider about this at age 18 when routine screening begins. A direct exam of the colon should be repeated every 5-10 years through age 61, unless early forms of precancerous polyps or small growths are found.  People who are at an increased risk for hepatitis B should be screened for this virus. You are considered at high risk for hepatitis B if:  You were born in a country where hepatitis B occurs often. Talk with your health care provider about which countries are considered high risk.  Your parents were born in a high-risk country and you have not received a shot to protect against hepatitis B (hepatitis B vaccine).  You have HIV or AIDS.  You use needles to inject street drugs.  You live with, or have sex with, someone who has hepatitis B.  You are a man who has sex with other men (MSM).  You get  hemodialysis treatment.  You take certain medicines for conditions like cancer, organ transplantation, and autoimmune conditions.  Hepatitis C blood testing is recommended for all people born from [birth year] through 1965 and any individual with known risk factors for hepatitis C.  Healthy men should no longer receive prostate-specific antigen (PSA) blood tests as part of routine cancer screening. Talk to your health care provider about prostate cancer screening.  Testicular cancer screening is not recommended for adolescents or adult males who have no symptoms. Screening includes self-exam, a health care provider exam, and other screening tests. Consult with your health care provider about any symptoms you have or any concerns you have about testicular cancer.  Practice safe sex. Use condoms and avoid high-risk sexual practices to reduce the spread of sexually transmitted infections (STIs).  You should be screened  for STIs, including gonorrhea and chlamydia if:  You are sexually active and are younger than 24 years.  You are older than 24 years, and your health care provider tells you that you are at risk for this type of infection.  Your sexual activity has changed since you were last screened, and you are at an increased risk for chlamydia or gonorrhea. Ask your health care provider if you are at risk.  If you are at risk of being infected with HIV, it is recommended that you take a prescription medicine daily to prevent HIV infection. This is called pre-exposure prophylaxis (PrEP). You are considered at risk if:  You are a man who has sex with other men (MSM).  You are a heterosexual man who is sexually active with multiple partners.  You take drugs by injection.  You are sexually active with a partner who has HIV.  Talk with your health care provider about whether you are at high risk of being infected with HIV. If you choose to begin PrEP, you should first be tested for HIV. You should  then be tested every 3 months for as long as you are taking PrEP.  Use sunscreen. Apply sunscreen liberally and repeatedly throughout the day. You should seek shade when your shadow is shorter than you. Protect yourself by wearing long sleeves, pants, a wide-brimmed hat, and sunglasses year round whenever you are outdoors.  Tell your health care provider of new moles or changes in moles, especially if there is a change in shape or color. Also, tell your health care provider if a mole is larger than the size of a pencil eraser.  A one-time screening for abdominal aortic aneurysm (AAA) and surgical repair of large AAAs by ultrasound is recommended for men aged 31-75 years who are current or former smokers.  Stay current with your vaccines (immunizations).   This information is not intended to replace advice given to you by your health care provider. Make sure you discuss any questions you have with your health care provider.   Document Released: 03/06/2008 Document Revised: 09/29/2014 Document Reviewed: 02/03/2011 Elsevier Interactive Patient Education [employer].

## 2016-05-19 NOTE — Progress Notes (Signed)
Pre visit review using our clinic review tool, if applicable. No additional management support is needed unless otherwise documented below in the visit note. 

## 2016-06-30 ENCOUNTER — Encounter: Payer: Self-pay | Admitting: Family Medicine

## 2016-06-30 ENCOUNTER — Ambulatory Visit (INDEPENDENT_AMBULATORY_CARE_PROVIDER_SITE_OTHER): Payer: Managed Care, Other (non HMO) | Admitting: Family Medicine

## 2016-06-30 ENCOUNTER — Ambulatory Visit (INDEPENDENT_AMBULATORY_CARE_PROVIDER_SITE_OTHER)
Admission: RE | Admit: 2016-06-30 | Discharge: 2016-06-30 | Disposition: A | Discharge status: Home / Self Care | Payer: Managed Care, Other (non HMO) | Source: Ambulatory Visit | Attending: Family Medicine | Admitting: Family Medicine

## 2016-06-30 VITALS — BP 100/62 | HR 53 | Temp 97.7°F | Ht 73.0 in | Wt 211.2 lb

## 2016-06-30 DIAGNOSIS — M25512 Pain in left shoulder: Secondary | ICD-10-CM

## 2016-06-30 NOTE — Patient Instructions (Signed)

## 2016-06-30 NOTE — Progress Notes (Signed)
Pre visit review using our clinic review tool, if applicable. No additional management support is needed unless otherwise documented below in the visit note. 

## 2016-06-30 NOTE — Progress Notes (Signed)
Dr. Karleen HampshireSpencer T. Slayter Moorhouse, MD, CAQ Sports Medicine Primary Care and Sports Medicine [address] [place], [address] Phone: [phone] Fax: [phone]  06/30/2016  Patient: Aaron RidgeSean Bruneau, MRN: [id number], DOB: January 18, 1986, 30 y.o.  Primary Physician:  Eustaquio BoydenJavier Gutierrez, MD   Chief Complaint  Patient presents with  . Shoulder Pain    Left-Hurt doing pull up at the gym in end of Aug.   Subjective:   Aaron RidgeSean Baik is a 30 y.o. very pleasant male patient who presents with the following:  End of August The patient was doing butterfly pull-ups, and he was doing pull-ups, but his arms were in a 90 angle out from his body at the time of injury. Left shoulder, heard something and felt something rip.   Not really gotten better. More outside of gym it will hurt. He is having trouble lifting his 89 and a half-year-old are that is primarily in a flexion movement. He is able to do virtually everything at the gym, but he has backed off his pressing movements and backed off his weights on the clean and jerk.   Never had a dislocation. No known subluxation. Pulled forward and felt a rip.   Past Medical History, Surgical History, Social History, Family History, Problem List, Medications, and Allergies have been reviewed and updated if relevant.  Patient Active Problem List   Diagnosis Date Noted  . Skin callus 05/19/2016  . Health maintenance examination 08/30/2014  . Tinea versicolor 08/30/2014    Past Medical History:  Diagnosis Date  . Seasonal allergies     Past Surgical History:  Procedure Laterality Date  . NO PAST SURGERIES      Social History   Social History  . Marital status: Married    Spouse name: N/A  . Number of children: N/A  . Years of education: N/A   Occupational History  . Not on file.   Social History Main Topics  . Smoking status: Never Smoker  . Smokeless tobacco: Never Used  . Alcohol use 0.0 oz/week     Comment: Occasional  . Drug use: No  .  Sexual activity: Yes   Other Topics Concern  . 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 Cancer Mother     breast  . Anxiety disorder Mother   . Stroke Neg Hx   . Hypertension Neg Hx   . Diabetes Neg Hx   . CAD Neg Hx     No Known Allergies  Medication list reviewed and updated in full in [facility].  GEN: No fevers, chills. Nontoxic. Primarily MSK c/o today. MSK: Detailed in the HPI GI: tolerating PO intake without difficulty Neuro: No numbness, parasthesias, or tingling associated. Otherwise the pertinent positives of the ROS are noted above.   Objective:   BP 100/62   Pulse (!) 53   Temp 97.7 F (36.5 C) (Oral)   Ht 6\' 1"  (1.854 m)   Wt 211 lb 4 oz (95.8 kg)   BMI 27.87 kg/m    GEN: WDWN, NAD, Non-toxic, Alert & Oriented x 3 HEENT: Atraumatic, Normocephalic.  Ears and Nose: No external deformity. EXTR: No clubbing/cyanosis/edema  NEURO: Normal gait.  PSYCH: Normally interactive. Conversant. Not depressed or anxious appearing.  Calm demeanor.   Shoulder: LEFT Inspection: No muscle wasting or winging Ecchymosis/edema: neg  AC joint, scapula, clavicle: NT Cervical spine: NT, full ROM Abduction: full, 5/5 Flexion: full, 5/5 IR, full, lift-off: 5/5 ER at neutral: full, 5/5 AC crossover and compression: neg Neer: neg Hawkins: neg Drop Test: neg Empty Can: neg Supraspinatus insertion: NT Bicipital groove: mild ttp - speeds is mildly pos Sulcus sign: neg, but minimal movement Apprehension: neg O'Brien's: POS Jobe Relocation: neg Crank: pos for mechanical clunk Jerk test is negative Load and shift laxity: Scapular dyskinesis: with doing push-ups mild  winging on the left    Radiology: Dg Shoulder Left  Result Date: 06/30/2016 CLINICAL DATA:  Persistent pain following injury 6 weeks prior EXAM: LEFT SHOULDER - 2+ VIEW COMPARISON:  None. FINDINGS: Frontal, Y scapular, and axillary images were obtained. There is no fracture or dislocation. The joint spaces appear normal. No erosive change. Visualized left lung clear. IMPRESSION: No fracture or dislocation.  No evident arthropathy. Electronically Signed   By: Bretta Bang III M.D.   On: 06/30/2016 14:08     Assessment and Plan:   Acute pain of left shoulder - Plan: DG Shoulder Left, Ambulatory referral to Physical Therapy  >25 minutes spent in face to face time with patient, >50% spent in counselling or coordination of care   Clinical suspicion for glenoid labral tear of the left shoulder.  Reviewed different treatment strategies going from conservative to more aggressive. Most treatment algorithms would favor a trial of conservative management. Very knowledgeable weightlifter, and he will be sent to do shoulder physical therapy with an emphasis on scapular stabilization. Consult Posey Pronto, PT.  Follow-up with me in 6 weeks. Discussed other options including obtaining an MR arthrogram and consideration of surgical consultation. Will review state of his shoulder again at follow-up.  Follow-up: 6 weeks  Orders Placed This Encounter  Procedures  . DG Shoulder Left  . Ambulatory referral to Physical Therapy    Signed,  Karleen Hampshire T. Dhruva Orndoff, MD   Patient's Medications  New Prescriptions   No medications on file  Previous Medications   MULTIPLE VITAMIN PO    Take 2 each by mouth daily. Gummies  Modified Medications   No medications on file  Discontinued Medications   PEDIATRIC MULTIVIT-MINERALS-C (RA GUMMY VITAMINS & MINERALS PO)    Take 1 tablet by mouth daily.

## 2016-08-11 ENCOUNTER — Ambulatory Visit: Payer: Managed Care, Other (non HMO) | Admitting: Family Medicine

## 2016-08-20 ENCOUNTER — Encounter: Payer: Self-pay | Admitting: Family Medicine

## 2016-08-20 ENCOUNTER — Ambulatory Visit (INDEPENDENT_AMBULATORY_CARE_PROVIDER_SITE_OTHER): Payer: Managed Care, Other (non HMO) | Admitting: Family Medicine

## 2016-08-20 VITALS — BP 118/80 | HR 53 | Temp 97.6°F | Ht 73.0 in | Wt 210.5 lb

## 2016-08-20 DIAGNOSIS — M25512 Pain in left shoulder: Secondary | ICD-10-CM | POA: Diagnosis not present

## 2016-08-20 NOTE — Progress Notes (Signed)
Pre visit review using our clinic review tool, if applicable. No additional management support is needed unless otherwise documented below in the visit note. 

## 2016-08-20 NOTE — Progress Notes (Signed)
Dr. Karleen HampshireSpencer T. Concepcion Kirkpatrick, MD, CAQ Sports Medicine Primary Care and Sports Medicine [address] [place], [address] Phone: [phone] Fax: [phone]  08/20/2016  Patient: Aaron Pugh, MRN: [id number], DOB: 07-30-1986, 30 y.o.  Primary Physician:  Eustaquio BoydenJavier Gutierrez, MD   Chief Complaint  Patient presents with  . Follow-up    Left Shoulder   Subjective:   Aaron Pugh is a 30 y.o. very pleasant male patient who presents with the following:  F/u L shoulder  Still pain - went to PT 3 times.  Scap stab much better.   Went back downhill again.  Still having some pain, not deep within. Now more posterior.  Ongoing now 13 weeks.   06/30/2016 Last OV with Hannah BeatSpencer Camaya Gannett, MD  End of August The patient was doing butterfly pull-ups, and he was doing pull-ups, but his arms were in a 90 angle out from his body at the time of injury. Left shoulder, heard something and felt something rip.   Not really gotten better. More outside of gym it will hurt. He is having trouble lifting his 89 and a half-year-old are that is primarily in a flexion movement. He is able to do virtually everything at the gym, but he has backed off his pressing movements and backed off his weights on the clean and jerk.   Never had a dislocation. No known subluxation. Pulled forward and felt a rip.   Past Medical History, Surgical History, Social History, Family History, Problem List, Medications, and Allergies have been reviewed and updated if relevant.  Patient Active Problem List   Diagnosis Date Noted  . Skin callus 05/19/2016  . Health maintenance examination 08/30/2014  . Tinea versicolor 08/30/2014    Past Medical History:  Diagnosis Date  . Seasonal allergies     Past Surgical History:  Procedure Laterality Date  . NO PAST SURGERIES      Social History   Social History  . Marital status: Married    Spouse name: N/A  . Number of children: N/A  . Years of education: N/A    Occupational History  . Not on file.   Social History Main Topics  . Smoking status: Never Smoker  . Smokeless tobacco: Never Used  . Alcohol use 0.0 oz/week     Comment: Occasional  . Drug use: No  . Sexual activity: Yes   Other Topics Concern  . 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 Cancer Mother     breast  . Anxiety disorder Mother   . Stroke Neg Hx   . Hypertension Neg Hx   . Diabetes Neg Hx   . CAD Neg Hx     No Known Allergies  Medication list reviewed and updated in full in [facility] Link.  GEN: No fevers, chills. Nontoxic. Primarily MSK c/o today. MSK: Detailed in the HPI GI: tolerating PO intake without difficulty Neuro: No numbness, parasthesias, or tingling associated. Otherwise the pertinent positives of the ROS are noted above.   Objective:   BP 118/80   Pulse (!) 53   Temp 97.6 F (36.4 C) (Oral)   Ht 6\' 1"  (1.854 m)  Wt 210 lb 8 oz (95.5 kg)   BMI 27.77 kg/m    GEN: WDWN, NAD, Non-toxic, Alert & Oriented x 3 HEENT: Atraumatic, Normocephalic.  Ears and Nose: No external deformity. EXTR: No clubbing/cyanosis/edema NEURO: Normal gait.  PSYCH: Normally interactive. Conversant. Not depressed or anxious appearing.  Calm demeanor.   Shoulder: LEFT Inspection: No muscle wasting or winging Ecchymosis/edema: neg  AC joint, scapula, clavicle: NT Cervical spine: NT, full ROM Abduction: full, 5/5 Flexion: full, 5/5 IR, full, lift-off: 5/5 ER at neutral: full, 5/5 AC crossover and compression: neg Neer: neg Hawkins: neg Drop Test: neg Empty Can: neg Supraspinatus insertion: NT Bicipital groove: mild ttp - speeds is mildly pos Sulcus sign:  neg, but minimal movement Apprehension: neg O'Brien's: POS Jobe Relocation: neg Crank: pos for mechanical clunk Jerk test is negative Load and shift laxity: Scapular dyskinesis: with doing push-ups mild winging on the left    Radiology: Dg Shoulder Left  Result Date: 06/30/2016 CLINICAL DATA:  Persistent pain following injury 6 weeks prior EXAM: LEFT SHOULDER - 2+ VIEW COMPARISON:  None. FINDINGS: Frontal, Y scapular, and axillary images were obtained. There is no fracture or dislocation. The joint spaces appear normal. No erosive change. Visualized left lung clear. IMPRESSION: No fracture or dislocation.  No evident arthropathy. Electronically Signed   By: Bretta BangWilliam  Woodruff III M.D.   On: 06/30/2016 14:08   Assessment and Plan:   Acute pain of left shoulder   >25 minutes spent in face to face time with patient, >50% spent in counselling or coordination of care   Long conversation with the patient. We discussed a relevant anatomy again. Clinical exam is most consistent with a SLAP lesion versus other labral tear. Suspicious that he has subluxation of that when he heard and felt some tearing initially doing butterfly pull-ups. Some improvement with physical therapy, but still considerable limitations from an athletic standpoint.  This point I discussed other options including continuing with conservative management, working on his internal range of motion as well as a scapular stabilization. Other options would include consideration of obtaining an MR arthrogram to evaluate native anatomy to a greater degree and particularly evaluate for possible labral pathology. This would be consideration of operative management.  I did not best answer all of his questions, but by the time that he left he still wanted to give this some more time and will think about this for a few weeks, and see how it goes with his training.  Follow-up: With this individual's training schedule, I will him to make his  own follow-up, likely in January.  Signed,  Elpidio GaleaSpencer T. Casin Federici, MD     Medication List       Accurate as of 08/20/16  1:07 PM. Always use your most recent med list.          MULTIPLE VITAMIN PO Take 2 each by mouth daily. Gummies

## 2017-02-04 ENCOUNTER — Ambulatory Visit (INDEPENDENT_AMBULATORY_CARE_PROVIDER_SITE_OTHER): Payer: Managed Care, Other (non HMO) | Admitting: Family Medicine

## 2017-02-04 ENCOUNTER — Encounter: Payer: Self-pay | Admitting: Family Medicine

## 2017-02-04 ENCOUNTER — Telehealth: Payer: Self-pay | Admitting: Family Medicine

## 2017-02-04 VITALS — BP 112/70 | HR 73 | Temp 98.3°F | Ht 73.0 in | Wt 214.5 lb

## 2017-02-04 DIAGNOSIS — J209 Acute bronchitis, unspecified: Secondary | ICD-10-CM | POA: Insufficient documentation

## 2017-02-04 MED ORDER — AZITHROMYCIN 250 MG PO TABS: ORAL_TABLET | ORAL | 0 refills | Status: DC

## 2017-02-04 MED ORDER — BENZONATATE 200 MG PO CAPS: 200.0000 mg | ORAL_CAPSULE | Freq: Three times a day (TID) | ORAL | 1 refills | Status: DC | PRN

## 2017-02-04 NOTE — Patient Instructions (Signed)
Drink lots of fluids  Rest when you can  Take the zithromax as directed   Try the tessalon for cough  Also mucinex DM   For ear- get flonase nasal spray and use daily for 2 weeks (1 spray each nostril each day)

## 2017-02-04 NOTE — Telephone Encounter (Signed)
Seen in clinic

## 2017-02-04 NOTE — Progress Notes (Signed)
Subjective:    Patient ID: Aaron Pugh, male    DOB: June 30, 1986, 31 y.o.   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 Acute bronchitis 02/04/2017  . Skin callus 05/19/2016  . Health maintenance examination 08/30/2014  . Tinea versicolor 08/30/2014   Past Medical History:  Diagnosis Date  . Seasonal allergies    Past Surgical History:  Procedure Laterality Date  . NO PAST SURGERIES     Social History  Substance Use Topics  . Smoking status: Never Smoker  . Smokeless tobacco: Never Used  . Alcohol use 0.0 oz/week     Comment: Occasional   Family History  Problem Relation Age of Onset  . Cancer Mother        breast  . Anxiety disorder Mother   . Stroke Neg Hx   . Hypertension Neg Hx   . Diabetes Neg Hx   . CAD Neg Hx    No Known Allergies Current Outpatient Prescriptions on File Prior to Visit  Medication Sig Dispense Refill  . MULTIPLE VITAMIN PO Take 2 each by mouth daily. Gummies     No current facility-administered medications on file prior to visit.      Review of Systems Review of Systems  Constitutional: Negative for fever, appetite change, and unexpected weight change. pos for  fatigue  ENT pos for pnd and congestion /neg for sinus pain neg for st pos for ear fullness Eyes: Negative for pain and visual disturbance.  Respiratory: Negative for wheeze/sob, pos for cough    Cardiovascular: Negative for cp or palpitations    Gastrointestinal: Negative for nausea, diarrhea and constipation.  Genitourinary: Negative for urgency and frequency.  Skin: Negative for pallor or rash   Neurological: Negative for weakness, light-headedness, numbness and headaches.  Hematological: Negative for adenopathy. Does not bruise/bleed easily.  Psychiatric/Behavioral: Negative for dysphoric mood. The patient is not nervous/anxious.         Objective:   Physical Exam  Constitutional: He appears well-developed and well-nourished. No distress.  Well appearing  HENT:  Head: Normocephalic and atraumatic.  Right Ear: External ear normal.  Left Ear: External ear normal.  Mouth/Throat: Oropharynx is clear and moist.  Nares are injected and congested  No sinus tenderness Clear rhinorrhea and post nasal drip  R TM is dull and retracted L TM is dull   Eyes: Conjunctivae and EOM are normal. Pupils are equal, round, and reactive to light. Right eye exhibits no discharge. Left eye exhibits no discharge.  Neck: Normal  range of motion. Neck supple.  Cardiovascular: Normal rate and normal heart sounds.   Pulmonary/Chest: Effort normal and breath sounds normal. No respiratory distress. He has no wheezes. He has no rales. He exhibits no tenderness.  Scant rhonchi at bases Good air exch No rales or wheezing   Lymphadenopathy:    He has no cervical adenopathy.  Neurological: He is alert.  Skin: Skin is warm and dry. No rash noted. No pallor.  Psychiatric: He has a normal mood and affect.          Assessment & Plan:   Problem List Items Addressed This Visit      Respiratory   Acute bronchitis    S/p uri -more than 10 days  Cover with zpak due to duration of symptoms  For assoc  ETD-flonase Tessalon prn cough Rest/fluids  Avoid excessive training until better if poss  Continue expectorant with dm if needed Update if not starting to improve in a week or if worsening

## 2017-02-04 NOTE — Telephone Encounter (Signed)
Patient Name: Ebbie RidgeSEAN Diles DOB: 09/25/1985 Initial Comment Caller states he's having breathing trouble, can't take a deep breath. Nurse Assessment Nurse: Charna Elizabethrumbull, RN, Cathy Date/Time (Eastern Time): 02/04/2017 2:26:11 PM Confirm and document reason for call. If symptomatic, describe symptoms. ---Gregary SignsSean states that he developed cold/cough symptoms about 9 days ago. He states it has become difficult to take a deep breath in. No wheezing or stridor. No severe breathing difficulty. No Chest pain. He thinks he developed a low grade fever about 10 days ago. . Alert and responsive. Does the patient have any new or worsening symptoms? ---Yes Will a triage be completed? ---Yes Related visit to physician within the last 2 weeks? ---No Does the PT have any chronic conditions? (i.e. diabetes, asthma, etc.) ---No Is this a behavioral health or substance abuse call? ---No Guidelines Guideline Title Affirmed Question Affirmed Notes Cough - Acute Productive Fever present > 3 days (72 hours) Final Disposition User See Physician within 24 Hours Trumbull, RN, Cathy Comments Scheduled for 3:30pm appointment today with Dr. Milinda Antisower. Referrals REFERRED TO PCP OFFICE Disagree/Comply: Comply

## 2017-02-04 NOTE — Telephone Encounter (Signed)
Pt seeing Dr Milinda Antisower this afternoon.

## 2017-02-05 NOTE — Assessment & Plan Note (Signed)
S/p uri -more than 10 days  Cover with zpak due to duration of symptoms  For assoc ETD-flonase Tessalon prn cough Rest/fluids  Avoid excessive training until better if poss  Continue expectorant with dm if needed Update if not starting to improve in a week or if worsening

## 2017-05-26 ENCOUNTER — Other Ambulatory Visit (INDEPENDENT_AMBULATORY_CARE_PROVIDER_SITE_OTHER): Payer: Managed Care, Other (non HMO)

## 2017-05-26 ENCOUNTER — Other Ambulatory Visit: Payer: Self-pay | Admitting: Family Medicine

## 2017-05-26 DIAGNOSIS — E785 Hyperlipidemia, unspecified: Secondary | ICD-10-CM

## 2017-05-26 LAB — LIPID PANEL
Cholesterol: 144 mg/dL (ref 0–200)
HDL: 44.1 mg/dL (ref >39.00)
LDL Cholesterol: 91 mg/dL (ref 0–99)
NonHDL: 100.21
Total CHOL/HDL Ratio: 3
Triglycerides: 44 mg/dL (ref 0.0–149.0)
VLDL: 8.8 mg/dL (ref 0.0–40.0)

## 2017-05-26 LAB — BASIC METABOLIC PANEL
BUN: 13 mg/dL (ref 6–23)
CALCIUM: 8.9 mg/dL (ref 8.4–10.5)
CO2: 29 meq/L (ref 19–32)
Chloride: 104 mEq/L (ref 96–112)
Creatinine, Ser: 1.12 mg/dL (ref 0.40–1.50)
GFR: 81.26 mL/min (ref >60.00)
GLUCOSE: 99 mg/dL (ref 70–99)
Potassium: 4.7 mEq/L (ref 3.5–5.1)
Sodium: 138 mEq/L (ref 135–145)

## 2017-05-28 ENCOUNTER — Encounter: Payer: Self-pay | Admitting: Family Medicine

## 2017-05-28 ENCOUNTER — Ambulatory Visit (INDEPENDENT_AMBULATORY_CARE_PROVIDER_SITE_OTHER): Payer: Managed Care, Other (non HMO) | Admitting: Family Medicine

## 2017-05-28 VITALS — BP 110/70 | HR 70 | Temp 98.1°F | Ht 73.0 in | Wt 210.0 lb

## 2017-05-28 DIAGNOSIS — G8929 Other chronic pain: Secondary | ICD-10-CM | POA: Insufficient documentation

## 2017-05-28 DIAGNOSIS — M25512 Pain in left shoulder: Secondary | ICD-10-CM

## 2017-05-28 DIAGNOSIS — B360 Pityriasis versicolor: Secondary | ICD-10-CM | POA: Diagnosis not present

## 2017-05-28 DIAGNOSIS — Z0000 Encounter for general adult medical examination without abnormal findings: Secondary | ICD-10-CM | POA: Diagnosis not present

## 2017-05-28 MED ORDER — FLUCONAZOLE 150 MG PO TABS: 300.0000 mg | ORAL_TABLET | ORAL | 0 refills | Status: DC

## 2017-05-28 NOTE — Progress Notes (Signed)
BP 110/70 (BP Location: Left Arm, Patient Position: Sitting, Cuff Size: Large)   Pulse 70   Temp 98.1 F (36.7 C) (Oral)   Ht  (1.854 m)   Wt 210 lb (95.3 kg)   SpO2 100%   BMI 27.71 kg/m    CC: CPE Subjective:    Patient ID: Aaron Pugh, male    DOB: 1985-12-13, 31 y.o.   MRN: [id number]  HPI: Aaron Pugh is a 31 y.o. male presenting on 05/28/2017 for Annual Exam (Seems to always feel sick with Toddler at home that goes to daycare.)   Ongoing malaise with toddler who goes to daycare.   Preventative: Flu shot at work Tetanus - 2006. Tdap 2016 Seat belt use discussed Sunscreen use discussed - doesn't wear. No changing moles on skin. Non smoker Alcohol - rare  Caffeine: /day Lives with wife, child (2016), 2 cats  Occupation: [employer] copy  Edu: Agricultural consultant  Activity: works out at lunch daily, runner, active crossfit member since 2015  Diet: good water, fruits/vegetables daily, high carb intake   Relevant past medical, surgical, family and social history reviewed and updated as indicated. Interim medical history since our last visit reviewed. Allergies and medications reviewed and updated. Outpatient Medications Prior to Visit  Medication Sig Dispense Refill  . MULTIPLE VITAMIN PO Take 2 each by mouth daily. Gummies    . azithromycin (ZITHROMAX Z-PAK) 250 MG tablet Take 2 pills by mouth today and then 1 pill daily for 4 days 6 tablet 0  . benzonatate (TESSALON) 200 MG capsule Take 1 capsule (200 mg total) by mouth 3 (three) times daily as needed for cough. Swallow whole -do not bite pill 30 capsule 1   No facility-administered medications prior to visit.      Per HPI unless specifically indicated in ROS section below Review of Systems  Constitutional: Negative for activity change, appetite change, chills, fatigue, fever and unexpected weight change.  HENT: Negative for hearing loss.   Eyes: Negative for visual disturbance.  Respiratory:  Negative for cough, chest tightness, shortness of breath and wheezing.   Cardiovascular: Negative for chest pain, palpitations and leg swelling.  Gastrointestinal: Negative for abdominal distention, abdominal pain, blood in stool, constipation, diarrhea, nausea and vomiting.  Genitourinary: Negative for difficulty urinating and hematuria.  Musculoskeletal: Negative for arthralgias, myalgias and neck pain.  Skin: Negative for rash.  Neurological: Negative for dizziness, seizures, syncope and headaches.  Hematological: Negative for adenopathy. Does not bruise/bleed easily.  Psychiatric/Behavioral: Negative for dysphoric mood. The patient is not nervous/anxious.        Objective:    BP 110/70 (BP Location: Left Arm, Patient Position: Sitting, Cuff Size: Large)   Pulse 70   Temp 98.1 F (36.7 C) (Oral)   Ht  (1.854 m)   Wt 210 lb (95.3 kg)   SpO2 100%   BMI 27.71 kg/m   Wt Readings from Last 3 Encounters:  05/28/17 210 lb (95.3 kg)  02/04/17 214 lb 8 oz (97.3 kg)  08/20/16 210 lb 8 oz (95.5 kg)    Physical Exam  Constitutional: He is oriented to person, place, and time. He appears well-developed and well-nourished. No distress.  HENT:  Head: Normocephalic and atraumatic.  Right Ear: Hearing, tympanic membrane, external ear and ear canal normal.  Left Ear: Hearing, tympanic membrane, external ear and ear canal normal.  Nose: Nose normal.  Mouth/Throat: Uvula is midline, oropharynx is clear and moist and mucous membranes are normal. No  oropharyngeal exudate, posterior oropharyngeal edema or posterior oropharyngeal erythema.  Eyes: Pupils are equal, round, and reactive to light. Conjunctivae and EOM are normal. No scleral icterus.  Neck: Normal range of motion. Neck supple. No thyromegaly present.  Cardiovascular: Normal rate, regular rhythm, normal heart sounds and intact distal pulses.   No murmur heard. Pulses:      Radial pulses are 2+ on the right side, and 2+ on the left  side.  Pulmonary/Chest: Effort normal and breath sounds normal. No respiratory distress. He has no wheezes. He has no rales.  Abdominal: Soft. Bowel sounds are normal. He exhibits no distension and no mass. There is no tenderness. There is no rebound and no guarding.  Musculoskeletal: Normal range of motion. He exhibits no edema.  Lymphadenopathy:    He has no cervical adenopathy.  Neurological: He is alert and oriented to person, place, and time.  CN grossly intact, station and gait intact  Skin: Skin is warm and dry. No rash noted.  Psychiatric: He has a normal mood and affect. His behavior is normal. Judgment and thought content normal.  Nursing note and vitals reviewed.  Results for orders placed or performed in visit on 05/26/17  Lipid panel  Result Value Ref Range   Cholesterol 144 0 - 200 mg/dL   Triglycerides 19.144.0 0.0 - 149.0 mg/dL   HDL 47.8244.10 >95.62>39.00 mg/dL   VLDL 8.8 0.0 - 13.040.0 mg/dL   LDL Cholesterol 91 0 - 99 mg/dL   Total CHOL/HDL Ratio 3    NonHDL 100.21   Basic metabolic panel  Result Value Ref Range   Sodium 138 135 - 145 mEq/L   Potassium 4.7 3.5 - 5.1 mEq/L   Chloride 104 96 - 112 mEq/L   CO2 29 19 - 32 mEq/L   Glucose, Bld 99 70 - 99 mg/dL   BUN 13 6 - 23 mg/dL   Creatinine, Ser 8.651.12 0.40 - 1.50 mg/dL   Calcium 8.9 8.4 - 78.410.5 mg/dL   GFR 69.6281.26 >95.28>60.00 mL/min      Assessment & Plan:   Problem List Items Addressed This Visit    Chronic left shoulder pain    Ongoing intermittent shoulder discomfort due to presumed shoulder instability ?labral tear. Had discussed with Dr Patsy Lageropland. Asks about costs for arthrogram. I gave him CPT codes for these (41324(23350, [id number] and [id number]) and he will investigate cost and contact SM if desires to proceed with evaluation.       Health maintenance examination - Primary    Preventative protocols reviewed and updated unless pt declined. Discussed healthy diet and lifestyle.       Tinea versicolor    Recurrent. Rpt diflucan course,  reviewed selsun blue maintenance therapy.           Follow up plan: No Follow-up on file.  Eustaquio BoydenJavier Dalya Maselli, MD

## 2017-05-28 NOTE — Assessment & Plan Note (Addendum)
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 Had discussed with Dr Patsy Lageropland. Asks about costs for arthrogram. I gave him CPT codes for these (16109(23350, [id number] and [id number]) and he will investigate cost and contact SM if desires to proceed with evaluation.

## 2017-05-28 NOTE — Assessment & Plan Note (Signed)
Preventative protocols reviewed and updated unless pt declined. Discussed healthy diet and lifestyle.  

## 2017-05-28 NOTE — Patient Instructions (Addendum)
Diflucan sent in for tinea versicolor. Then continue selsun blue as needed. You are doing well today. Cholesterol levels are improved.  Return as needed or in 1 year for next physical.  Health Maintenance, Male A healthy lifestyle and preventive care is important for your health and wellness. Ask your health care provider about what schedule of regular examinations is right for you. What should I know about weight and diet? Eat a Healthy Diet  Eat plenty of vegetables, fruits, whole grains, low-fat dairy products, and lean protein.  Do not eat a lot of foods high in solid fats, added sugars, or salt.  Maintain a Healthy Weight Regular exercise can help you achieve or maintain a healthy weight. You should:  Do at least 150 minutes of exercise each week. The exercise should increase your heart rate and make you sweat (moderate-intensity exercise).  Do strength-training exercises at least twice a week.  Watch Your Levels of Cholesterol and Blood Lipids  Have your blood tested for lipids and cholesterol every 5 years starting at 31 years of age. If you are at high risk for heart disease, you should start having your blood tested when you are 31 years old. You may need to have your cholesterol levels checked more often if: ? Your lipid or cholesterol levels are high. ? You are older than 31 years of age. ? You are at high risk for heart disease.  What should I know about cancer screening? Many types of cancers can be detected early and may often be prevented. Lung Cancer  You should be screened every year for lung cancer if: ? You are a current smoker who has smoked for at least 30 years. ? You are a former smoker who has quit within the past 15 years.  Talk to your health care provider about your screening options, when you should start screening, and how often you should be screened.  Colorectal Cancer  Routine colorectal cancer screening usually begins at 31 years of age and  should be repeated every 5-10 years until you are 31 years old. You may need to be screened more often if early forms of precancerous polyps or small growths are found. Your health care provider may recommend screening at an earlier age if you have risk factors for colon cancer.  Your health care provider may recommend using home test kits to check for hidden blood in the stool.  A small camera at the end of a tube can be used to examine your colon (sigmoidoscopy or colonoscopy). This checks for the earliest forms of colorectal cancer.  Prostate and Testicular Cancer  Depending on your age and overall health, your health care provider may do certain tests to screen for prostate and testicular cancer.  Talk to your health care provider about any symptoms or concerns you have about testicular or prostate cancer.  Skin Cancer  Check your skin from head to toe regularly.  Tell your health care provider about any new moles or changes in moles, especially if: ? There is a change in a mole's size, shape, or color. ? You have a mole that is larger than a pencil eraser.  Always use sunscreen. Apply sunscreen liberally and repeat throughout the day.  Protect yourself by wearing long sleeves, pants, a wide-brimmed hat, and sunglasses when outside.  What should I know about heart disease, diabetes, and high blood pressure?  If you are 1218-31 years of age, have your blood pressure checked every 3-5 years. If you  are 53 years of age or older, have your blood pressure checked every year. You should have your blood pressure measured twice-once when you are at a hospital or clinic, and once when you are not at a hospital or clinic. Record the average of the two measurements. To check your blood pressure when you are not at a hospital or clinic, you can use: ? An automated blood pressure machine at a pharmacy. ? A home blood pressure monitor.  Talk to your health care provider about your target blood  pressure.  If you are between 19-34 years old, ask your health care provider if you should take aspirin to prevent heart disease.  Have regular diabetes screenings by checking your fasting blood sugar level. ? If you are at a normal weight and have a low risk for diabetes, have this test once every three years after the age of 37. ? If you are overweight and have a high risk for diabetes, consider being tested at a younger age or more often.  A one-time screening for abdominal aortic aneurysm (AAA) by ultrasound is recommended for men aged 79-75 years who are current or former smokers. What should I know about preventing infection? Hepatitis B If you have a higher risk for hepatitis B, you should be screened for this virus. Talk with your health care provider to find out if you are at risk for hepatitis B infection. Hepatitis C Blood testing is recommended for:  Everyone born from [birth year] through 1965.  Anyone with known risk factors for hepatitis C.  Sexually Transmitted Diseases (STDs)  You should be screened each year for STDs including gonorrhea and chlamydia if: ? You are sexually active and are younger than 31 years of age. ? You are older than 31 years of age and your health care provider tells you that you are at risk for this type of infection. ? Your sexual activity has changed since you were last screened and you are at an increased risk for chlamydia or gonorrhea. Ask your health care provider if you are at risk.  Talk with your health care provider about whether you are at high risk of being infected with HIV. Your health care provider may recommend a prescription medicine to help prevent HIV infection.  What else can I do?  Schedule regular health, dental, and eye exams.  Stay current with your vaccines (immunizations).  Do not use any tobacco products, such as cigarettes, chewing tobacco, and e-cigarettes. If you need help quitting, ask your health care  provider.  Limit alcohol intake to no more than 2 drinks per day. One drink equals 12 ounces of beer, 5 ounces of wine, or 1 ounces of hard liquor.  Do not use street drugs.  Do not share needles.  Ask your health care provider for help if you need support or information about quitting drugs.  Tell your health care provider if you often feel depressed.  Tell your health care provider if you have ever been abused or do not feel safe at home. This information is not intended to replace advice given to you by your health care provider. Make sure you discuss any questions you have with your health care provider. Document Released: 03/06/2008 Document Revised: 05/07/2016 Document Reviewed: 06/12/2015 Elsevier Interactive Patient Education  [employer].

## 2017-05-28 NOTE — Assessment & Plan Note (Signed)
Recurrent. Rpt diflucan course, reviewed selsun blue maintenance therapy.

## 2017-10-27 IMAGING — DX DG SHOULDER 2+V*L*
3 series · 3 of 3 positions shown · non-contrast
Comparison: None.

CLINICAL DATA: Persistent pain following injury 6 weeks prior

EXAM:
LEFT SHOULDER - 2+ VIEW

[shoulder axial]
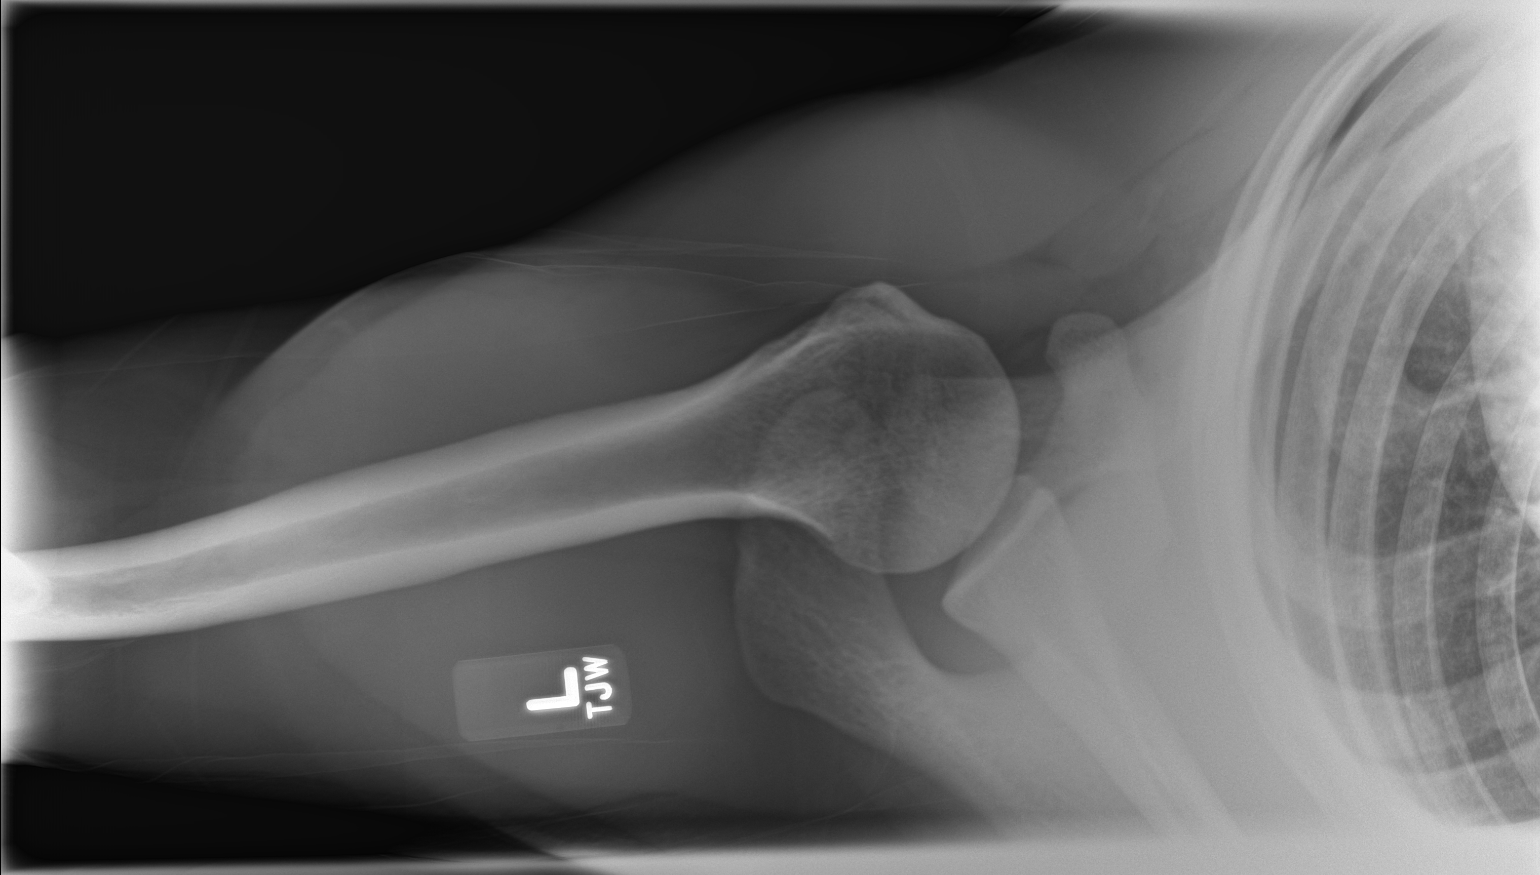

[shoulder ap]
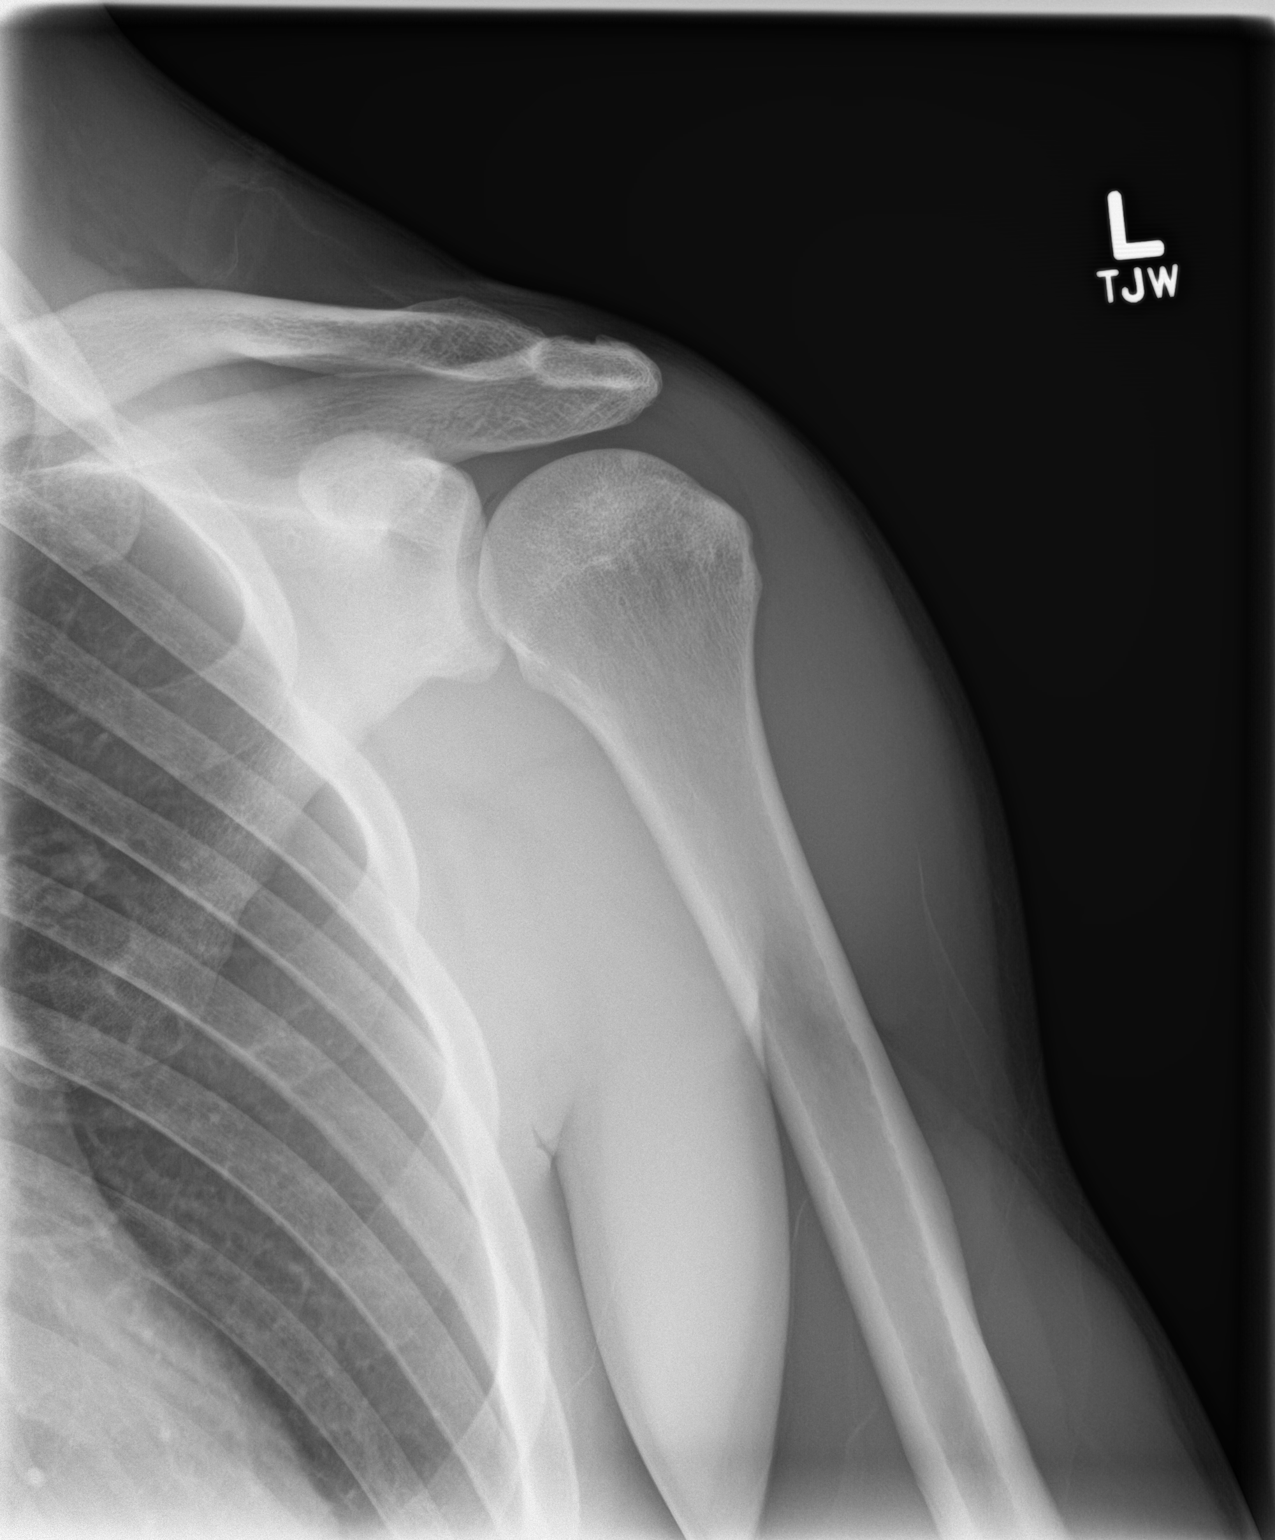

[shoulder y-view]
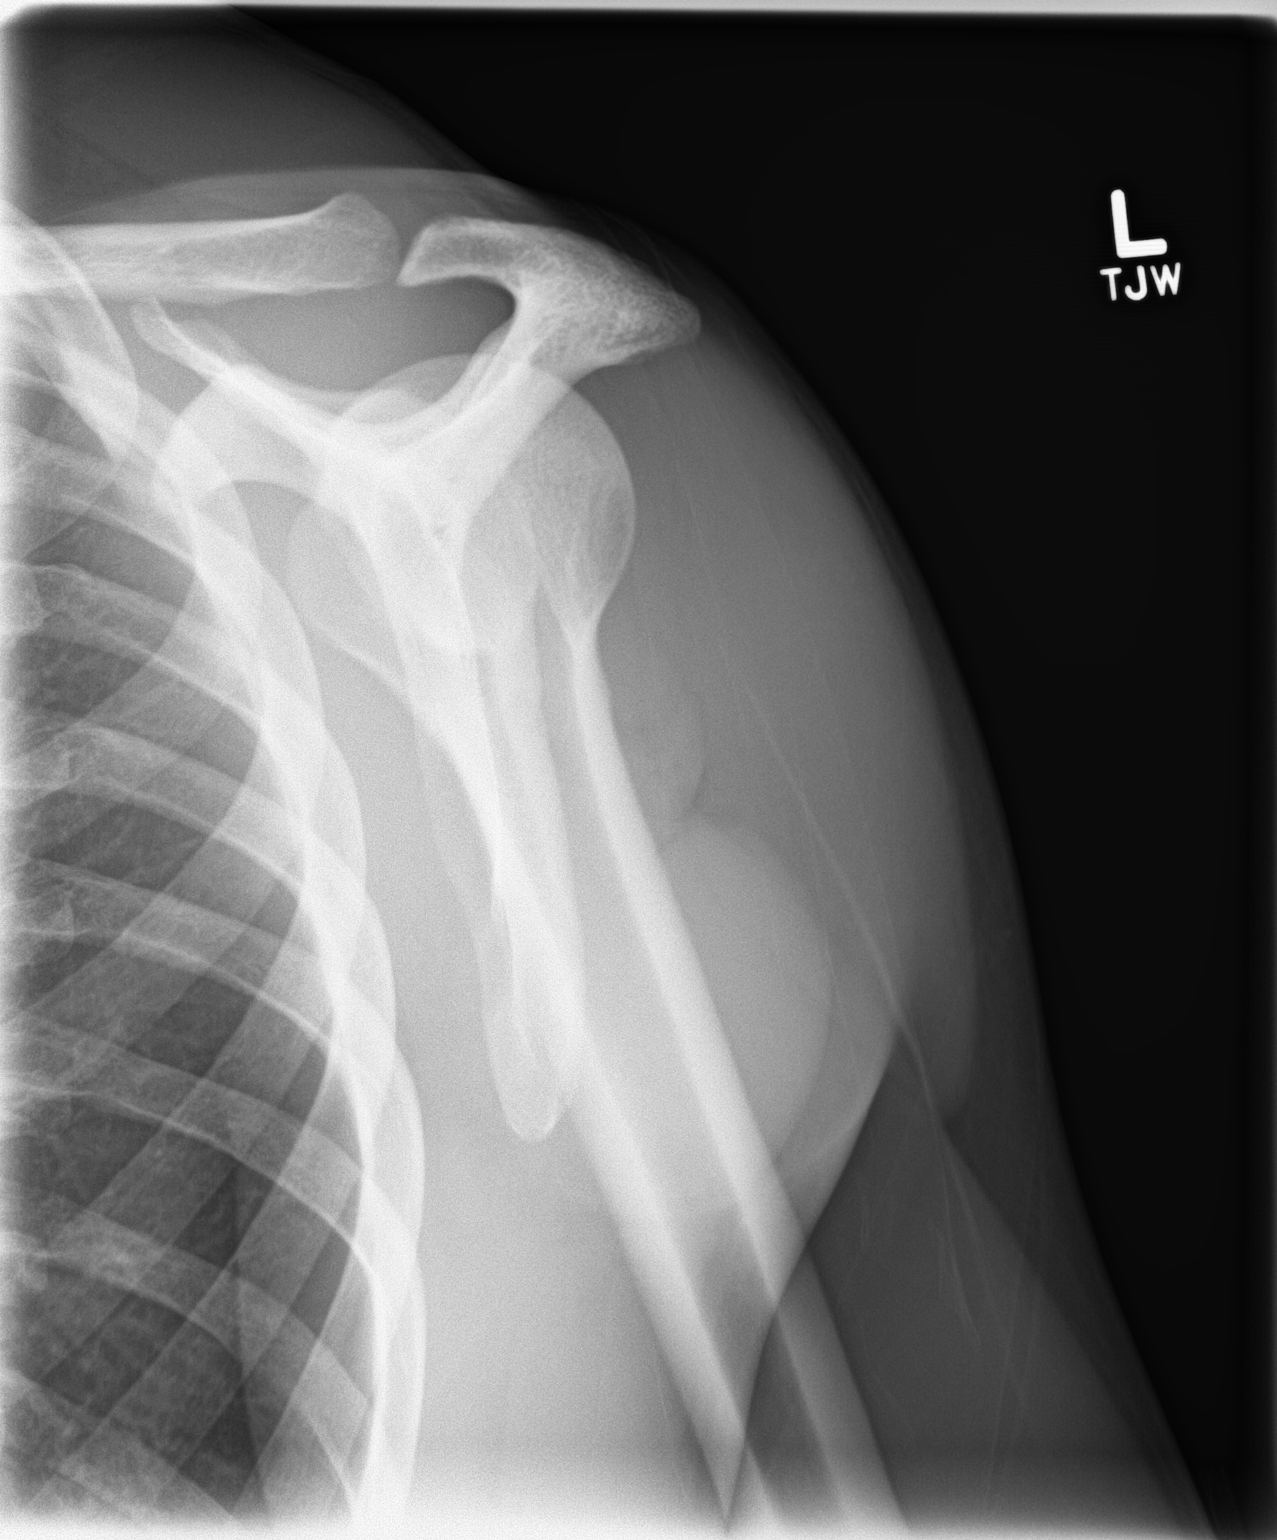

[3 of 3 positions shown; findings below may reference images not displayed]

FINDINGS: Frontal, Y scapular, and axillary images were obtained. There is no
fracture or dislocation. The joint spaces appear normal. No erosive
change. Visualized left lung clear.
IMPRESSION: No fracture or dislocation.  No evident arthropathy.

## 2018-05-26 ENCOUNTER — Other Ambulatory Visit: Payer: Self-pay | Admitting: Family Medicine

## 2018-05-26 DIAGNOSIS — Z13220 Encounter for screening for lipoid disorders: Secondary | ICD-10-CM

## 2018-05-26 DIAGNOSIS — Z131 Encounter for screening for diabetes mellitus: Secondary | ICD-10-CM

## 2018-05-27 ENCOUNTER — Other Ambulatory Visit (INDEPENDENT_AMBULATORY_CARE_PROVIDER_SITE_OTHER): Payer: Managed Care, Other (non HMO)

## 2018-05-27 ENCOUNTER — Other Ambulatory Visit: Payer: Managed Care, Other (non HMO)

## 2018-05-27 DIAGNOSIS — Z13220 Encounter for screening for lipoid disorders: Secondary | ICD-10-CM | POA: Diagnosis not present

## 2018-05-27 DIAGNOSIS — Z131 Encounter for screening for diabetes mellitus: Secondary | ICD-10-CM

## 2018-05-27 LAB — BASIC METABOLIC PANEL
BUN: 17 mg/dL (ref 6–23)
CALCIUM: 9 mg/dL (ref 8.4–10.5)
CO2: 31 meq/L (ref 19–32)
CREATININE: 1.2 mg/dL (ref 0.40–1.50)
Chloride: 106 mEq/L (ref 96–112)
GFR: 74.56 mL/min (ref >60.00)
Glucose, Bld: 102 mg/dL — ABNORMAL HIGH (ref 70–99)
POTASSIUM: 4.5 meq/L (ref 3.5–5.1)
Sodium: 140 mEq/L (ref 135–145)

## 2018-05-27 LAB — LIPID PANEL
CHOL/HDL RATIO: 3
Cholesterol: 152 mg/dL (ref 0–200)
HDL: 50.1 mg/dL (ref >39.00)
LDL Cholesterol: 91 mg/dL (ref 0–99)
NonHDL: 101.7
Triglycerides: 56 mg/dL (ref 0.0–149.0)
VLDL: 11.2 mg/dL (ref 0.0–40.0)

## 2018-05-31 ENCOUNTER — Encounter: Payer: Self-pay | Admitting: Family Medicine

## 2018-05-31 ENCOUNTER — Ambulatory Visit (INDEPENDENT_AMBULATORY_CARE_PROVIDER_SITE_OTHER): Payer: Managed Care, Other (non HMO) | Admitting: Family Medicine

## 2018-05-31 VITALS — BP 118/80 | HR 68 | Temp 98.4°F | Ht 73.0 in | Wt 214.0 lb

## 2018-05-31 DIAGNOSIS — F341 Dysthymic disorder: Secondary | ICD-10-CM | POA: Insufficient documentation

## 2018-05-31 DIAGNOSIS — F4323 Adjustment disorder with mixed anxiety and depressed mood: Secondary | ICD-10-CM | POA: Insufficient documentation

## 2018-05-31 DIAGNOSIS — B360 Pityriasis versicolor: Secondary | ICD-10-CM | POA: Diagnosis not present

## 2018-05-31 DIAGNOSIS — Z0000 Encounter for general adult medical examination without abnormal findings: Secondary | ICD-10-CM

## 2018-05-31 DIAGNOSIS — Z23 Encounter for immunization: Secondary | ICD-10-CM

## 2018-05-31 MED ORDER — FLUCONAZOLE 150 MG PO TABS: 300.0000 mg | ORAL_TABLET | ORAL | 0 refills | Status: DC

## 2018-05-31 NOTE — Progress Notes (Signed)
BP 118/80 (BP Location: Left Arm, Patient Position: Sitting, Cuff Size: Normal)   Pulse 68   Temp 98.4 F (36.9 C) (Oral)   Ht 6\' 1"  (1.854 m)   Wt 214 lb (97.1 kg)   SpO2 97%   BMI 28.23 kg/m    CC: CPE Subjective:    Patient ID: Aaron Pugh, male    DOB: November 22, 1985, 32 y.o.   MRN: [id number]  HPI: Aaron Pugh is a 32 y.o. male presenting on 05/31/2018 for Annual Exam (States he has rash area on anterior left shoulder. States he usually gets this time of yr and has been treated with antifungal, which works. )   Recurrent tinea versicolor. Treating with selsun blue, better hygiene. This has helped. Wonders about low T. Some seasonal mood trouble. No decreased energy or libido. May be interested in T check in the future.   Preventative: Flu shot today Tetanus - 2006. Tdap 2016 Seat belt use discussed Sunscreen use discussed - doesn't wear. No changing moles on skin. Non smoker Alcohol - rare Dentist Q6 mo Eye exam yearly  Caffeine: 300mg /day Lives with wife, child (2016), 2 cats  Occupation: [employer] copy  Edu: Agricultural consultant  Activity: works out at lunch daily, runner, active crossfit member since 2015 active in competitions Diet: good water, fruits/vegetables daily, high carb intake   Relevant past medical, surgical, family and social history reviewed and updated as indicated. Interim medical history since our last visit reviewed. Allergies and medications reviewed and updated. Outpatient Medications Prior to Visit  Medication Sig Dispense Refill  . MAGNESIUM-ZINC PO Take 1 tablet by mouth daily.    . MULTIPLE VITAMIN PO Take 2 each by mouth daily. Gummies    . vitamin C (ASCORBIC ACID) 500 MG tablet Take 500 mg by mouth daily. emergenC    . fluconazole (DIFLUCAN) 150 MG tablet Take 2 tablets (300 mg total) by mouth once a week. 4 tablet 0   No facility-administered medications prior to visit.      Per HPI unless specifically indicated in ROS section  below Review of Systems  Constitutional: Negative for activity change, appetite change, chills, fatigue, fever and unexpected weight change.  HENT: Negative for hearing loss.   Eyes: Negative for visual disturbance.  Respiratory: Negative for cough, chest tightness, shortness of breath and wheezing.   Cardiovascular: Negative for chest pain, palpitations and leg swelling.  Gastrointestinal: Negative for abdominal distention, abdominal pain, blood in stool, constipation, diarrhea, nausea and vomiting.  Genitourinary: Negative for difficulty urinating and hematuria.  Musculoskeletal: Negative for arthralgias, myalgias and neck pain.  Skin: Negative for rash.  Neurological: Negative for dizziness, seizures, syncope and headaches.  Hematological: Negative for adenopathy. Does not bruise/bleed easily.  Psychiatric/Behavioral: Negative for dysphoric mood. The patient is not nervous/anxious.        Objective:    BP 118/80 (BP Location: Left Arm, Patient Position: Sitting, Cuff Size: Normal)   Pulse 68   Temp 98.4 F (36.9 C) (Oral)   Ht 6\' 1"  (1.854 m)   Wt 214 lb (97.1 kg)   SpO2 97%   BMI 28.23 kg/m   Wt Readings from Last 3 Encounters:  05/31/18 214 lb (97.1 kg)  05/28/17 210 lb (95.3 kg)  02/04/17 214 lb 8 oz (97.3 kg)    Physical Exam  Constitutional: He is oriented to person, place, and time. He appears well-developed and well-nourished. No distress.  HENT:  Head: Normocephalic and atraumatic.  Right Ear: Hearing, tympanic membrane,  external ear and ear canal normal.  Left Ear: Hearing, tympanic membrane, external ear and ear canal normal.  Nose: Nose normal.  Mouth/Throat: Uvula is midline, oropharynx is clear and moist and mucous membranes are normal. No oropharyngeal exudate, posterior oropharyngeal edema or posterior oropharyngeal erythema.  Eyes: Pupils are equal, round, and reactive to light. Conjunctivae and EOM are normal. No scleral icterus.  Neck: Normal range of  motion. Neck supple. No thyromegaly present.  Cardiovascular: Normal rate, regular rhythm, normal heart sounds and intact distal pulses.  No murmur heard. Pulses:      Radial pulses are 2+ on the right side, and 2+ on the left side.  Pulmonary/Chest: Effort normal and breath sounds normal. No respiratory distress. He has no wheezes. He has no rales.  Abdominal: Soft. Bowel sounds are normal. He exhibits no distension and no mass. There is no tenderness. There is no rebound and no guarding.  Musculoskeletal: Normal range of motion. He exhibits no edema.  Lymphadenopathy:    He has no cervical adenopathy.  Neurological: He is alert and oriented to person, place, and time.  CN grossly intact, station and gait intact  Skin: Skin is warm and dry. No rash noted.  Psychiatric: He has a normal mood and affect. His behavior is normal. Judgment and thought content normal.  Nursing note and vitals reviewed.  Results for orders placed or performed in visit on 05/27/18  Basic metabolic panel  Result Value Ref Range   Sodium 140 135 - 145 mEq/L   Potassium 4.5 3.5 - 5.1 mEq/L   Chloride 106 96 - 112 mEq/L   CO2 31 19 - 32 mEq/L   Glucose, Bld 102 (H) 70 - 99 mg/dL   BUN 17 6 - 23 mg/dL   Creatinine, Ser 6.56 0.40 - 1.50 mg/dL   Calcium 9.0 8.4 - 81.2 mg/dL   GFR 75.17 >00.17 mL/min  Lipid panel  Result Value Ref Range   Cholesterol 152 0 - 200 mg/dL   Triglycerides 49.4 0.0 - 149.0 mg/dL   HDL 49.67 >59.16 mg/dL   VLDL 38.4 0.0 - 66.5 mg/dL   LDL Cholesterol 91 0 - 99 mg/dL   Total CHOL/HDL Ratio 3    NonHDL 101.70       Assessment & Plan:   Problem List Items Addressed This Visit    Tinea versicolor    Recurrent, will Rx another diflucan course. Aware of preventative meaures      Health maintenance examination - Primary    Preventative protocols reviewed and updated unless pt declined. Discussed healthy diet and lifestyle.       Dysthymia    Mild, seasonal. May be interested  in returning for am lab check for baseline Testosterone levels. Aware this may not be covered by insurance.       Other Visit Diagnoses    Need for influenza vaccination       Relevant Orders   Flu Vaccine QUAD 36+ mos IM (Completed)       Meds ordered this encounter  Medications  . fluconazole (DIFLUCAN) 150 MG tablet    Sig: Take 2 tablets (300 mg total) by mouth once a week.    Dispense:  4 tablet    Refill:  0   Orders Placed This Encounter  Procedures  . Flu Vaccine QUAD 36+ mos IM    Follow up plan: Return in about 1 year (around 06/01/2019) for annual exam, prior fasting for blood work.  Eustaquio Boyden, MD

## 2018-05-31 NOTE — Assessment & Plan Note (Signed)
Preventative protocols reviewed and updated unless pt declined. Discussed healthy diet and lifestyle.  

## 2018-05-31 NOTE — Patient Instructions (Addendum)
Flu shot today Diflucan at pharmacy for tinea versicolor You are doing well today - continue healthy exercise and diet. Return as needed or in 1 year for next physical.  Tinea Versicolor Tinea versicolor is a common fungal infection of the skin. It causes a rash that appears as light or dark patches on the skin. The rash most often occurs on the chest, back, neck, or upper arms. This condition is more common during warm weather. Other than affecting how your skin looks, tinea versicolor usually does not cause other problems. In most cases, the infection goes away in a few weeks with treatment. It may take a few months for the patches on your skin to clear up. What are the causes? Tinea versicolor occurs when a type of fungus that is normally present on the skin starts to overgrow. This fungus is a kind of yeast. The exact cause of the overgrowth is not known. This condition cannot be passed from one person to another (noncontagious). What increases the risk? This condition is more likely to develop when certain factors are present, such as:  Heat and humidity.  Sweating too much.  Hormone changes.  Oily skin.  A weak defense (immune) system.  What are the signs or symptoms? Symptoms of this condition may include:  A rash on your skin that is made up of light or dark patches. The rash may have: ? Patches of tan or pink spots on light skin. ? Patches of white or brown spots on dark skin. ? Patches of skin that do not tan. ? Well-marked edges. ? Scales on the discolored areas.  Mild itching.  How is this diagnosed? A health care provider can usually diagnose this condition by looking at your skin. During the exam, he or she may use ultraviolet light to help determine the extent of the infection. In some cases, a skin sample may be taken by scraping the rash. This sample will be viewed under a microscope to check for yeast overgrowth. How is this treated? Treatment for this  condition may include:  Dandruff shampoo that is applied to the affected skin during showers or bathing.  Over-the-counter medicated skin cream, lotion, or soaps.  Prescription antifungal medicine in the form of skin cream or pills.  Medicine to help reduce itching.  Follow these instructions at home:  Take medicines only as directed by your health care provider.  Apply dandruff shampoo to the affected area if told to do so by your health care provider. You may be instructed to scrub the affected skin for several minutes each day.  Do not scratch the affected area of skin.  Avoid hot and humid conditions.  Do not use tanning booths.  Try to avoid sweating a lot. Contact a health care provider if:  Your symptoms get worse.  You have a fever.  You have redness, swelling, or pain at the site of your rash.  You have fluid, blood, or pus coming from your rash.  Your rash returns after treatment. This information is not intended to replace advice given to you by your health care provider. Make sure you discuss any questions you have with your health care provider. Document Released: 09/05/2000 Document Revised: 05/11/2016 Document Reviewed: 06/20/2014 Elsevier Interactive Patient Education  2018 [employer].

## 2018-05-31 NOTE — Assessment & Plan Note (Addendum)
Recurrent, will Rx another diflucan course. Aware of preventative meaures

## 2018-05-31 NOTE — Assessment & Plan Note (Addendum)
Mild, seasonal. May be interested in returning for am lab check for baseline Testosterone levels. Aware this may not be covered by insurance.

## 2019-06-03 ENCOUNTER — Telehealth: Payer: Self-pay

## 2019-06-03 NOTE — Telephone Encounter (Signed)
Left detailed VM w COVID screen and back door lab info and front door info   

## 2019-06-07 ENCOUNTER — Other Ambulatory Visit: Payer: Managed Care, Other (non HMO)

## 2019-06-07 ENCOUNTER — Other Ambulatory Visit: Payer: Self-pay | Admitting: Family Medicine

## 2019-06-07 DIAGNOSIS — B360 Pityriasis versicolor: Secondary | ICD-10-CM

## 2019-06-07 DIAGNOSIS — Z131 Encounter for screening for diabetes mellitus: Secondary | ICD-10-CM

## 2019-06-07 DIAGNOSIS — F341 Dysthymic disorder: Secondary | ICD-10-CM

## 2019-06-09 ENCOUNTER — Encounter: Payer: Managed Care, Other (non HMO) | Admitting: Family Medicine

## 2019-07-18 ENCOUNTER — Telehealth: Payer: Self-pay

## 2019-07-18 NOTE — Telephone Encounter (Signed)
LVM w COVID screen, back lab and front door info  

## 2019-07-21 ENCOUNTER — Other Ambulatory Visit (INDEPENDENT_AMBULATORY_CARE_PROVIDER_SITE_OTHER): Payer: Managed Care, Other (non HMO)

## 2019-07-21 DIAGNOSIS — B360 Pityriasis versicolor: Secondary | ICD-10-CM

## 2019-07-21 DIAGNOSIS — Z131 Encounter for screening for diabetes mellitus: Secondary | ICD-10-CM

## 2019-07-21 DIAGNOSIS — F341 Dysthymic disorder: Secondary | ICD-10-CM | POA: Diagnosis not present

## 2019-07-21 LAB — COMPREHENSIVE METABOLIC PANEL
ALT: 22 U/L (ref 0–53)
AST: 29 U/L (ref 0–37)
Albumin: 4.4 g/dL (ref 3.5–5.2)
Alkaline Phosphatase: 53 U/L (ref 39–117)
BUN: 13 mg/dL (ref 6–23)
CO2: 34 mEq/L — ABNORMAL HIGH (ref 19–32)
Calcium: 9.5 mg/dL (ref 8.4–10.5)
Chloride: 101 mEq/L (ref 96–112)
Creatinine, Ser: 1.18 mg/dL (ref 0.40–1.50)
GFR: 71.01 mL/min (ref >60.00)
Glucose, Bld: 92 mg/dL (ref 70–99)
Potassium: 4.4 mEq/L (ref 3.5–5.1)
Sodium: 140 mEq/L (ref 135–145)
Total Bilirubin: 0.9 mg/dL (ref 0.2–1.2)
Total Protein: 7.1 g/dL (ref 6.0–8.3)

## 2019-07-22 LAB — TESTOSTERONE: Testosterone: 597.96 ng/dL (ref 300.00–890.00)

## 2019-07-28 ENCOUNTER — Telehealth: Payer: Self-pay

## 2019-07-28 ENCOUNTER — Ambulatory Visit (INDEPENDENT_AMBULATORY_CARE_PROVIDER_SITE_OTHER): Payer: Managed Care, Other (non HMO) | Admitting: Family Medicine

## 2019-07-28 ENCOUNTER — Encounter: Payer: Self-pay | Admitting: Family Medicine

## 2019-07-28 ENCOUNTER — Other Ambulatory Visit: Payer: Self-pay

## 2019-07-28 VITALS — BP 120/78 | HR 80 | Temp 97.8°F | Ht 73.0 in | Wt 214.1 lb

## 2019-07-28 DIAGNOSIS — B360 Pityriasis versicolor: Secondary | ICD-10-CM

## 2019-07-28 DIAGNOSIS — Z0000 Encounter for general adult medical examination without abnormal findings: Secondary | ICD-10-CM

## 2019-07-28 DIAGNOSIS — F4323 Adjustment disorder with mixed anxiety and depressed mood: Secondary | ICD-10-CM

## 2019-07-28 DIAGNOSIS — Z23 Encounter for immunization: Secondary | ICD-10-CM | POA: Diagnosis not present

## 2019-07-28 MED ORDER — FLUCONAZOLE 150 MG PO TABS: 300.0000 mg | ORAL_TABLET | ORAL | 0 refills | Status: DC

## 2019-07-28 MED ORDER — KETOCONAZOLE 1 % EX SHAM: MEDICATED_SHAMPOO | CUTANEOUS | 3 refills | Status: DC

## 2019-07-28 MED ORDER — KETOCONAZOLE 1 % EX SHAM: MEDICATED_SHAMPOO | CUTANEOUS | 1 refills | Status: DC

## 2019-07-28 NOTE — Assessment & Plan Note (Signed)
Preventative protocols reviewed and updated unless pt declined. Discussed healthy diet and lifestyle.  

## 2019-07-28 NOTE — Patient Instructions (Addendum)
Flu shot today Treat tinea versicolor with ketoconazole shampoo - once a week for a month then monthly preventatively.  We will refer you to counselor - update me with how you're doing and return in 2 months for follow up visit.   Health Maintenance, Male Adopting a healthy lifestyle and getting preventive care are important in promoting health and wellness. Ask your health care provider about:  The right schedule for you to have regular tests and exams.  Things you can do on your own to prevent diseases and keep yourself healthy. What should I know about diet, weight, and exercise? Eat a healthy diet   Eat a diet that includes plenty of vegetables, fruits, low-fat dairy products, and lean protein.  Do not eat a lot of foods that are high in solid fats, added sugars, or sodium. Maintain a healthy weight Body mass index (BMI) is a measurement that can be used to identify possible weight problems. It estimates body fat based on height and weight. Your health care provider can help determine your BMI and help you achieve or maintain a healthy weight. Get regular exercise Get regular exercise. This is one of the most important things you can do for your health. Most adults should:  Exercise for at least 150 minutes each week. The exercise should increase your heart rate and make you sweat (moderate-intensity exercise).  Do strengthening exercises at least twice a week. This is in addition to the moderate-intensity exercise.  Spend less time sitting. Even light physical activity can be beneficial. Watch cholesterol and blood lipids Have your blood tested for lipids and cholesterol at 33 years of age, then have this test every 5 years. You may need to have your cholesterol levels checked more often if:  Your lipid or cholesterol levels are high.  You are older than 33 years of age.  You are at high risk for heart disease. What should I know about cancer screening? Many types of cancers  can be detected early and may often be prevented. Depending on your health history and family history, you may need to have cancer screening at various ages. This may include screening for:  Colorectal cancer.  Prostate cancer.  Skin cancer.  Lung cancer. What should I know about heart disease, diabetes, and high blood pressure? Blood pressure and heart disease  High blood pressure causes heart disease and increases the risk of stroke. This is more likely to develop in people who have high blood pressure readings, are of African descent, or are overweight.  Talk with your health care provider about your target blood pressure readings.  Have your blood pressure checked: ? Every 3-5 years if you are 75-29 years of age. ? Every year if you are 62 years old or older.  If you are between the ages of 75 and 72 and are a current or former smoker, ask your health care provider if you should have a one-time screening for abdominal aortic aneurysm (AAA). Diabetes Have regular diabetes screenings. This checks your fasting blood sugar level. Have the screening done:  Once every three years after age 45 if you are at a normal weight and have a low risk for diabetes.  More often and at a younger age if you are overweight or have a high risk for diabetes. What should I know about preventing infection? Hepatitis B If you have a higher risk for hepatitis B, you should be screened for this virus. Talk with your health care provider to find  out if you are at risk for hepatitis B infection. Hepatitis C Blood testing is recommended for:  Everyone born from [birth year] through 1965.  Anyone with known risk factors for hepatitis C. Sexually transmitted infections (STIs)  You should be screened each year for STIs, including gonorrhea and chlamydia, if: ? You are sexually active and are younger than 33 years of age. ? You are older than 33 years of age and your health care provider tells you that you are at  risk for this type of infection. ? Your sexual activity has changed since you were last screened, and you are at increased risk for chlamydia or gonorrhea. Ask your health care provider if you are at risk.  Ask your health care provider about whether you are at high risk for HIV. Your health care provider may recommend a prescription medicine to help prevent HIV infection. If you choose to take medicine to prevent HIV, you should first get tested for HIV. You should then be tested every 3 months for as long as you are taking the medicine. Follow these instructions at home: Lifestyle  Do not use any products that contain nicotine or tobacco, such as cigarettes, e-cigarettes, and chewing tobacco. If you need help quitting, ask your health care provider.  Do not use street drugs.  Do not share needles.  Ask your health care provider for help if you need support or information about quitting drugs. Alcohol use  Do not drink alcohol if your health care provider tells you not to drink.  If you drink alcohol: ? Limit how much you have to 0-2 drinks a day. ? Be aware of how much alcohol is in your drink. In the [place] one drink equals one 12 oz bottle of beer (355 mL), one 5 oz glass of wine (148 mL), or one 1 oz glass of hard liquor (44 mL). General instructions  Schedule regular health, dental, and eye exams.  Stay current with your vaccines.  Tell your health care provider if: ? You often feel depressed. ? You have ever been abused or do not feel safe at home. Summary  Adopting a healthy lifestyle and getting preventive care are important in promoting health and wellness.  Follow your health care provider's instructions about healthy diet, exercising, and getting tested or screened for diseases.  Follow your health care provider's instructions on monitoring your cholesterol and blood pressure. This information is not intended to replace advice given to you by your health care  provider. Make sure you discuss any questions you have with your health care provider. Document Released: 03/06/2008 Document Revised: 09/01/2018 Document Reviewed: 09/01/2018 Elsevier Patient Education  2020 [employer].

## 2019-07-28 NOTE — Assessment & Plan Note (Addendum)
With seasonal variability. Support provided. Struggling for clarity with life decisions. Will benefit from counseling. Referral placed. RTC 2 mo f/u visit. Considering medication as well.

## 2019-07-28 NOTE — Assessment & Plan Note (Signed)
Ongoing, minimal. Will trial ketoconazole shampoo preventatively.  Don't think he needs diflucan at this time.

## 2019-07-28 NOTE — Progress Notes (Signed)
This visit was conducted in person.  BP 120/78 (BP Location: Left Arm, Patient Position: Sitting, Cuff Size: Normal)   Pulse 80   Temp 97.8 F (36.6 C) (Temporal)   Ht 6\' 1"  (1.854 m)   Wt 214 lb 2 oz (97.1 kg)   SpO2 98%   BMI 28.25 kg/m    CC: CPE Subjective:    Patient ID: , male    DOB: [birth year], 33 y.o.   MRN: [id number]  HPI: Aaron Pugh is a 33 y.o. male presenting on 07/28/2019 for Annual Exam (Wants to discuss refill for Diflucan. )   Recurrent tinea versicolor - managed intermittently with selsun blue and lotrimin cream.   Struggling with depressed mood over the last 10 months, pandemic has worsened symptoms. Interested in counseling. Has been through marriage counseling, some marital discord, No SI/HI. Increased anxiety noted these past 4 yrs since he became father.  Depression screen [facility] 2/9 07/28/2019 05/31/2018 05/28/2017  Decreased Interest 2 0 0  Down, Depressed, Hopeless 3 0 0  PHQ - 2 Score 5 0 0  Altered sleeping 0 - -  Tired, decreased energy 2 - -  Change in appetite 1 - -  Feeling bad or failure about yourself  2 - -  Trouble concentrating 3 - -  Moving slowly or fidgety/restless 0 - -  Suicidal thoughts 0 - -  PHQ-9 Score 13 - -    GAD 7 : Generalized Anxiety Score 07/28/2019  Nervous, Anxious, on Edge 2  Control/stop worrying 3  Worry too much - different things 2  Trouble relaxing 2  Restless 1  Easily annoyed or irritable 1  Afraid - awful might happen 2  Total GAD 7 Score 13    Preventative: Flu shot today  Tetanus - 2006. Tdap 2016 Seat belt use discussed  Sunscreen use discussed. No changing moles on skin.  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 Interim medical history since our last visit reviewed. Allergies and medications reviewed and updated. Outpatient Medications Prior to Visit  Medication Sig Dispense Refill  . MAGNESIUM-ZINC PO Take 1 tablet by mouth daily.    . MULTIPLE VITAMIN PO Take 2 each by mouth daily. Gummies    . vitamin C (ASCORBIC ACID) 500 MG tablet Take 500 mg by mouth daily. emergenC    . fluconazole (DIFLUCAN) 150 MG tablet Take 2 tablets (300 mg total) by mouth once a week. 4 tablet 0   No facility-administered medications prior to visit.      Per HPI unless specifically indicated in ROS section below Review of Systems  Constitutional: Negative for activity change, appetite change, chills, fatigue, fever and unexpected weight change.  HENT: Negative for hearing loss.   Eyes: Negative for visual disturbance.  Respiratory: Negative for cough, chest tightness, shortness of breath and wheezing.   Cardiovascular: Negative for chest pain, palpitations and leg swelling.  Gastrointestinal: Negative for abdominal distention, abdominal pain, blood in stool, constipation, diarrhea, nausea and vomiting.  Genitourinary: Negative for difficulty urinating and hematuria.  Musculoskeletal: Negative for arthralgias, myalgias and neck pain.  Skin:  Negative for rash.  Neurological: Negative for dizziness, seizures, syncope and headaches.  Hematological: Negative for adenopathy. Does not bruise/bleed easily.  Psychiatric/Behavioral: Positive for dysphoric mood. The patient is nervous/anxious.    Objective:    BP 120/78 (BP Location: Left Arm, Patient Position: Sitting, Cuff Size: Normal)   Pulse 80   Temp 97.8 F (36.6 C) (Temporal)   Ht 6\' 1"  (1.854 m)   Wt 214 lb 2 oz (97.1 kg)   SpO2 98%   BMI 28.25 kg/m   Wt Readings from Last 3 Encounters:  07/28/19 214 lb 2 oz (97.1  kg)  05/31/18 214 lb (97.1 kg)  05/28/17 210 lb (95.3 kg)    Physical Exam Vitals signs and nursing note reviewed.  Constitutional:      General: He is not in acute distress.    Appearance: Normal appearance. He is well-developed. He is not ill-appearing.  HENT:     Head: Normocephalic and atraumatic.     Right Ear: Hearing, tympanic membrane, ear canal and external ear normal.     Left Ear: Hearing, tympanic membrane, ear canal and external ear normal.     Nose: Nose normal.     Mouth/Throat:     Mouth: Mucous membranes are moist.     Pharynx: Oropharynx is clear. Uvula midline. No posterior oropharyngeal erythema.  Eyes:     General: No scleral icterus.    Extraocular Movements: Extraocular movements intact.     Conjunctiva/sclera: Conjunctivae normal.     Pupils: Pupils are equal, round, and reactive to light.  Neck:     Musculoskeletal: Normal range of motion and neck supple.  Cardiovascular:     Rate and Rhythm: Normal rate and regular rhythm.     Pulses: Normal pulses.          Radial pulses are 2+ on the right side and 2+ on the left side.     Heart sounds: Normal heart sounds. No murmur.  Pulmonary:     Effort: Pulmonary effort is normal. No respiratory distress.     Breath sounds: Normal breath sounds. No wheezing, rhonchi or rales.  Abdominal:     General: Abdomen is flat. Bowel sounds are normal. There is no distension.     Palpations: Abdomen is soft. There is no mass.     Tenderness: There is no abdominal tenderness. There is no guarding or rebound.     Hernia: No hernia is present.  Musculoskeletal: Normal range of motion.     Right lower leg: No edema.     Left lower leg: No edema.  Lymphadenopathy:     Cervical: No cervical adenopathy.  Skin:    General: Skin is warm and dry.     Findings: No rash.  Neurological:     General: No focal deficit present.     Mental Status: He is alert and oriented to person, place, and time.     Comments: CN grossly  intact, station and gait intact  Psychiatric:        Mood and Affect: Mood is depressed.        Behavior: Behavior normal.        Thought Content: Thought content normal.        Judgment: Judgment normal.       Results for orders placed or performed in visit on 07/21/19  Testosterone  Result Value Ref Range   Testosterone 597.96 300.00 - 890.00 ng/dL  Comprehensive metabolic panel  Result Value Ref Range  Sodium 140 135 - 145 mEq/L   Potassium 4.4 3.5 - 5.1 mEq/L   Chloride 101 96 - 112 mEq/L   CO2 34 (H) 19 - 32 mEq/L   Glucose, Bld 92 70 - 99 mg/dL   BUN 13 6 - 23 mg/dL   Creatinine, Ser 1.611.18 0.40 - 1.50 mg/dL   Total Bilirubin 0.9 0.2 - 1.2 mg/dL   Alkaline Phosphatase 53 39 - 117 U/L   AST 29 0 - 37 U/L   ALT 22 0 - 53 U/L   Total Protein 7.1 6.0 - 8.3 g/dL   Albumin 4.4 3.5 - 5.2 g/dL   Calcium 9.5 8.4 - 09.610.5 mg/dL   GFR 04.5471.01 >09.81>60.00 mL/min   Assessment & Plan:   Problem List Items Addressed This Visit    Tinea versicolor    Ongoing, minimal. Will trial ketoconazole shampoo preventatively.  Don't think he needs diflucan at this time.       Health maintenance examination - Primary    Preventative protocols reviewed and updated unless pt declined. Discussed healthy diet and lifestyle.       Adjustment disorder with mixed anxiety and depressed mood    With seasonal variability. Support provided. Struggling for clarity with life decisions. Will benefit from counseling. Referral placed. RTC 2 mo f/u visit. Considering medication as well.       Relevant Orders   Ambulatory referral to Psychology    Other Visit Diagnoses    Need for influenza vaccination       Relevant Orders   Flu Vaccine QUAD 36+ mos IM (Completed)       Meds ordered this encounter  Medications  . DISCONTD: fluconazole (DIFLUCAN) 150 MG tablet    Sig: Take 2 tablets (300 mg total) by mouth once a week.    Dispense:  4 tablet    Refill:  0  . DISCONTD: KETOCONAZOLE, TOPICAL, 1 % SHAM     Sig: Apply topically as directed to skin weekly then monthly PRN tinea versicolor    Dispense:  125 mL    Refill:  3  . KETOCONAZOLE, TOPICAL, 1 % SHAM    Sig: Apply topically as directed to skin weekly then monthly PRN tinea versicolor    Dispense:  200 mL    Refill:  1    Use this dose   Orders Placed This Encounter  Procedures  . Flu Vaccine QUAD 36+ mos IM  . Ambulatory referral to Psychology    Referral Priority:   Routine    Referral Type:   Psychiatric    Referral Reason:   Specialty Services Required    Requested Specialty:   Psychology    Number of Visits Requested:   1    Patient instructions: Flu shot today Treat tinea versicolor with ketoconazole shampoo - once a week for a month then monthly preventatively.  We will refer you to counselor - update me with how you're doing and return in 2 months for follow up visit.   Follow up plan: Return in about 2 months (around 09/27/2019) for follow up visit.  Eustaquio BoydenJavier Ivin Rosenbloom, MD

## 2019-07-28 NOTE — Telephone Encounter (Signed)
Want 231mL ketoconazole 1% shampoo.

## 2019-07-28 NOTE — Telephone Encounter (Signed)
Aaron Pugh from [facility] left v/m that they received 2 rx for ketoconazole shampoo 1 % and request cb to pharmacy to verify what Dr Darnell Level wants pt to have. Aaron Pugh also has ketoconazole 2 % cream.Please advise.

## 2019-07-28 NOTE — Telephone Encounter (Signed)
Glen advised. He states this shampoo is OTC and they will let patient know that.

## 2019-08-22 ENCOUNTER — Ambulatory Visit (INDEPENDENT_AMBULATORY_CARE_PROVIDER_SITE_OTHER): Payer: 59 | Admitting: Psychology

## 2019-08-22 DIAGNOSIS — F4321 Adjustment disorder with depressed mood: Secondary | ICD-10-CM | POA: Diagnosis not present

## 2019-08-29 ENCOUNTER — Ambulatory Visit (INDEPENDENT_AMBULATORY_CARE_PROVIDER_SITE_OTHER): Payer: 59 | Admitting: Psychology

## 2019-08-29 DIAGNOSIS — F4321 Adjustment disorder with depressed mood: Secondary | ICD-10-CM

## 2019-09-09 ENCOUNTER — Ambulatory Visit (INDEPENDENT_AMBULATORY_CARE_PROVIDER_SITE_OTHER): Payer: 59 | Admitting: Psychology

## 2019-09-09 DIAGNOSIS — F4321 Adjustment disorder with depressed mood: Secondary | ICD-10-CM

## 2019-09-20 ENCOUNTER — Ambulatory Visit (INDEPENDENT_AMBULATORY_CARE_PROVIDER_SITE_OTHER): Payer: 59 | Admitting: Psychology

## 2019-09-20 DIAGNOSIS — F4321 Adjustment disorder with depressed mood: Secondary | ICD-10-CM

## 2019-09-29 ENCOUNTER — Encounter: Payer: Self-pay | Admitting: Family Medicine

## 2019-09-29 ENCOUNTER — Ambulatory Visit (INDEPENDENT_AMBULATORY_CARE_PROVIDER_SITE_OTHER): Payer: Managed Care, Other (non HMO) | Admitting: Family Medicine

## 2019-09-29 ENCOUNTER — Other Ambulatory Visit: Payer: Self-pay

## 2019-09-29 VITALS — BP 124/78 | HR 74 | Temp 98.5°F | Ht 73.0 in | Wt 217.0 lb

## 2019-09-29 DIAGNOSIS — F4323 Adjustment disorder with mixed anxiety and depressed mood: Secondary | ICD-10-CM

## 2019-09-29 DIAGNOSIS — R41840 Attention and concentration deficit: Secondary | ICD-10-CM | POA: Diagnosis not present

## 2019-09-29 MED ORDER — BUPROPION HCL ER (SR) 100 MG PO TB12: 100.0000 mg | ORAL_TABLET | Freq: Every day | ORAL | 3 refills | Status: DC

## 2019-09-29 NOTE — Assessment & Plan Note (Addendum)
Overall feeling better with counseling however PHQ9/GAD7 score largely unchanged. Has had several counseling sessions, planning to continue this. Reviewed strategies to help manage stressors. Given longevity of symptoms, discussed medication treatment - will start wellbutrin SL 100mg  daily, hopeful to help with focus as well (see above). RTC 4-6 wks f/u visit.

## 2019-09-29 NOTE — Patient Instructions (Signed)
Try wellbutrin 100mg  once daily.  Return in 4-6 weeks for follow up visit. Let know sooner if any trouble.  Good to see you today, call us with questions.

## 2019-09-29 NOTE — Assessment & Plan Note (Signed)
Meets criteria for ADD. Discussed further evaluation/management options - stimulant vs non stimulant. Will trial wellbutrin as per below, consider strattera. If not effective, would refer to psychology for formal adult ADD testing. Pt agrees with plan.

## 2019-09-29 NOTE — Progress Notes (Signed)
This visit was conducted in person.  BP 124/78 (BP Location: Left Arm, Patient Position: Sitting, Cuff Size: Large)   Pulse 74   Temp 98.5 F (36.9 C) (Temporal)   Ht 6\' 1"  (1.854 m)   Wt 217 lb (98.4 kg)   SpO2 98%   BMI 28.63 kg/m    CC: mood f/u visit Subjective:    Patient ID: Aaron Pugh, male    DOB: Feb 22, 1986, 34 y.o.   MRN: [id number]  HPI: Aaron Pugh is a 34 y.o. male presenting on 09/29/2019 for Follow-up (Here for 2 mo mood f/u.)   See prior note for details. Depressed mood for the past year - worse with pandemic concerns and some struggle with life decisions. Ongoing anxiety since he became a father. Thought adjustment disorder with mixed mood - referred for counseling. Has seen counselor 11/27/2019. Ongoing depression, anxiety over being father, struggling with work from home. Does struggle more with winter months as well, ?SAD.   Notes significant trouble with focus, attention. Doesn't think symptoms present prior to age 44 yo.  ADHD evaluation: Pt endorses persistent pattern of inattention and or hyperactivity/impulsivity that interferes with daily functioning. Symptoms present in 2 or more settings.  Inattention (6+, 6 months): Careless mistakes? yes Difficulty sustaining attention? yes Doesn't listen when spoken to directly? yes Lacks follow through with instructions or difficulty completing tasks? yes Difficulty organizing tasks/activities? yes Avoiding tasks that require sustained attention? yes Easily losing things needed for tasks? no Easily distracted by external stimuli? yes Forgetful in daily activities? yes Hyperactive/impulsive (6+, 6 months): Fidgeting, tapping hands/feet? yes Leaves seat when expected to remain in seat? no Feeling restless, or running around when not expected? yes Unable to engage in leisurely activities quietly? no Always on the go, "driven by a motor"? yes Excessive talking? yes Blurting out answer, responds to other's  questions? no Difficulty waiting in line or waiting turn? no Interrupting or intruding on others? no   Depression screen [facility] 2/9 09/29/2019 07/28/2019 05/31/2018 05/28/2017  Decreased Interest 1 2 0 0  Down, Depressed, Hopeless 1 3 0 0  PHQ - 2 Score 2 5 0 0  Altered sleeping 1 0 - -  Tired, decreased energy 3 2 - -  Change in appetite 2 1 - -  Feeling bad or failure about yourself  2 2 - -  Trouble concentrating 3 3 - -  Moving slowly or fidgety/restless 1 0 - -  Suicidal thoughts 0 0 - -  PHQ-9 Score 14 13 - -    GAD 7 : Generalized Anxiety Score 09/29/2019 07/28/2019  Nervous, Anxious, on Edge 2 2  Control/stop worrying 1 3  Worry too much - different things 2 2  Trouble relaxing 3 2  Restless 2 1  Easily annoyed or irritable 1 1  Afraid - awful might happen 1 2  Total GAD 7 Score 12 13       Relevant past medical, surgical, family and social history reviewed and updated as indicated. Interim medical history since our last visit reviewed. Allergies and medications reviewed and updated. Outpatient Medications Prior to Visit  Medication Sig Dispense Refill  . KETOCONAZOLE, TOPICAL, 1 % SHAM Apply topically as directed to skin weekly then monthly PRN tinea versicolor 200 mL 1  . MAGNESIUM-ZINC PO Take 1 tablet by mouth daily.    . MULTIPLE VITAMIN PO Take 2 each by mouth daily. Gummies    . vitamin C (ASCORBIC ACID) 500 MG tablet Take 500  mg by mouth daily. emergenC     No facility-administered medications prior to visit.     Per HPI unless specifically indicated in ROS section below Review of Systems Objective:    BP 124/78 (BP Location: Left Arm, Patient Position: Sitting, Cuff Size: Large)   Pulse 74   Temp 98.5 F (36.9 C) (Temporal)   Ht 6\' 1"  (1.854 m)   Wt 217 lb (98.4 kg)   SpO2 98%   BMI 28.63 kg/m   Wt Readings from Last 3 Encounters:  09/29/19 217 lb (98.4 kg)  07/28/19 214 lb 2 oz (97.1 kg)  05/31/18 214 lb (97.1 kg)    Physical Exam Vitals and nursing  note reviewed.  Constitutional:      Appearance: Normal appearance. He is not ill-appearing.  Neurological:     Mental Status: He is alert.  Psychiatric:        Mood and Affect: Mood normal.        Behavior: Behavior normal.       Depression screen [facility] 2/9 09/29/2019 07/28/2019 05/31/2018 05/28/2017  Decreased Interest 1 2 0 0  Down, Depressed, Hopeless 1 3 0 0  PHQ - 2 Score 2 5 0 0  Altered sleeping 1 0 - -  Tired, decreased energy 3 2 - -  Change in appetite 2 1 - -  Feeling bad or failure about yourself  2 2 - -  Trouble concentrating 3 3 - -  Moving slowly or fidgety/restless 1 0 - -  Suicidal thoughts 0 0 - -  PHQ-9 Score 14 13 - -    GAD 7 : Generalized Anxiety Score 09/29/2019 07/28/2019  Nervous, Anxious, on Edge 2 2  Control/stop worrying 1 3  Worry too much - different things 2 2  Trouble relaxing 3 2  Restless 2 1  Easily annoyed or irritable 1 1  Afraid - awful might happen 1 2  Total GAD 7 Score 12 13   Assessment & Plan:  This visit occurred during the SARS-CoV-2 public health emergency.  Safety protocols were in place, including screening questions prior to the visit, additional usage of staff PPE, and extensive cleaning of exam room while observing appropriate contact time as indicated for disinfecting solutions.   Problem List Items Addressed This Visit    Inattention    Meets criteria for ADD. Discussed further evaluation/management options - stimulant vs non stimulant. Will trial wellbutrin as per below, consider strattera. If not effective, would refer to psychology for formal adult ADD testing. Pt agrees with plan.       Adjustment disorder with mixed anxiety and depressed mood - Primary    Overall feeling better with counseling however PHQ9/GAD7 score largely unchanged. Has had several counseling sessions, planning to continue this. Reviewed strategies to help manage stressors. Given longevity of symptoms, discussed medication treatment - will start wellbutrin  SL 100mg  daily, hopeful to help with focus as well (see above). RTC 4-6 wks f/u visit.           Meds ordered this encounter  Medications  . buPROPion (WELLBUTRIN SR) 100 MG 12 hr tablet    Sig: Take 1 tablet (100 mg total) by mouth daily.    Dispense:  30 tablet    Refill:  3   No orders of the defined types were placed in this encounter.   Patient Instructions  Try wellbutrin 100mg  once daily.  Return in 4-6 weeks for follow up visit. Let 13/01/2019 know sooner if any  trouble.  Good to see you today, call us with questions.    Follow up plan: Return in about 6 weeks (around 11/10/2019), or if symptoms worsen or fail to improve.  [name], MD

## 2019-09-30 ENCOUNTER — Ambulatory Visit (INDEPENDENT_AMBULATORY_CARE_PROVIDER_SITE_OTHER): Payer: 59 | Admitting: Psychology

## 2019-09-30 DIAGNOSIS — F4321 Adjustment disorder with depressed mood: Secondary | ICD-10-CM | POA: Diagnosis not present

## 2019-10-07 ENCOUNTER — Ambulatory Visit (INDEPENDENT_AMBULATORY_CARE_PROVIDER_SITE_OTHER): Payer: 59 | Admitting: Psychology

## 2019-10-07 DIAGNOSIS — F4321 Adjustment disorder with depressed mood: Secondary | ICD-10-CM

## 2019-10-14 ENCOUNTER — Ambulatory Visit (INDEPENDENT_AMBULATORY_CARE_PROVIDER_SITE_OTHER): Payer: 59 | Admitting: Psychology

## 2019-10-14 DIAGNOSIS — F4321 Adjustment disorder with depressed mood: Secondary | ICD-10-CM

## 2019-10-21 ENCOUNTER — Ambulatory Visit (INDEPENDENT_AMBULATORY_CARE_PROVIDER_SITE_OTHER): Payer: 59 | Admitting: Psychology

## 2019-10-21 DIAGNOSIS — F4321 Adjustment disorder with depressed mood: Secondary | ICD-10-CM

## 2019-10-31 ENCOUNTER — Other Ambulatory Visit: Payer: Self-pay

## 2019-10-31 ENCOUNTER — Ambulatory Visit (INDEPENDENT_AMBULATORY_CARE_PROVIDER_SITE_OTHER): Payer: Managed Care, Other (non HMO) | Admitting: Family Medicine

## 2019-10-31 ENCOUNTER — Encounter: Payer: Self-pay | Admitting: Family Medicine

## 2019-10-31 VITALS — BP 112/70 | HR 75 | Temp 97.6°F | Ht 73.0 in | Wt 224.3 lb

## 2019-10-31 DIAGNOSIS — F4323 Adjustment disorder with mixed anxiety and depressed mood: Secondary | ICD-10-CM | POA: Diagnosis not present

## 2019-10-31 DIAGNOSIS — R41840 Attention and concentration deficit: Secondary | ICD-10-CM

## 2019-10-31 MED ORDER — BUPROPION HCL ER (XL) 300 MG PO TB24: 300.0000 mg | ORAL_TABLET | Freq: Every day | ORAL | 3 refills | Status: DC

## 2019-10-31 MED ORDER — BUPROPION HCL ER (XL) 150 MG PO TB24: 150.0000 mg | ORAL_TABLET | Freq: Every day | ORAL | 3 refills | Status: DC

## 2019-10-31 NOTE — Assessment & Plan Note (Signed)
Increase wellbutrin XL to 300mg  daily. RTC 4-6 wks f/u visit Continue counseling.  Support provided.

## 2019-10-31 NOTE — Assessment & Plan Note (Signed)
Increase wellbutrin to 300mg  XL.  Reassess on higher dose.

## 2019-10-31 NOTE — Progress Notes (Signed)
This visit was conducted in person.  BP 112/70 (BP Location: Left Arm, Patient Position: Sitting, Cuff Size: Large)   Pulse 75   Temp 97.6 F (36.4 C) (Temporal)   Ht 6\' 1"  (1.854 m)   Wt 224 lb 5 oz (101.7 kg)   SpO2 98%   BMI 29.59 kg/m    CC: 4 wk mood f/u visit Subjective:    Patient ID: Aaron Pugh, male    DOB: November 12, 1985, 34 y.o.   MRN: [id number]  HPI: Aaron Pugh is a 34 y.o. male presenting on 10/31/2019 for Follow-up (Here for 4-6 wk mood f/u.)   See prior note for details.  Depressed mood with pandemic and family stressors (anxiety as father) - dx adjustment disorder with mixed mood late last year ?SAD referred for counseling 12/29/2019). Last visit also screened positive for adult ADD - started wellbutrin for above.  Noted some improvement in impulsivity. Otherwise similar issues as last visit.   Depression screen [facility] 2/9 10/31/2019 09/29/2019 07/28/2019 05/31/2018 05/28/2017  Decreased Interest 1 1 2  0 0  Down, Depressed, Hopeless - 1 3 0 0  PHQ - 2 Score 1 2 5  0 0  Altered sleeping 1 1 0 - -  Tired, decreased energy 2 3 2  - -  Change in appetite 2 2 1  - -  Feeling bad or failure about yourself  1 2 2  - -  Trouble concentrating 2 3 3  - -  Moving slowly or fidgety/restless 0 1 0 - -  Suicidal thoughts 0 0 0 - -  PHQ-9 Score 9 14 13  - -    GAD 7 : Generalized Anxiety Score 10/31/2019 09/29/2019 07/28/2019  Nervous, Anxious, on Edge 2 2 2   Control/stop worrying 2 1 3   Worry too much - different things 2 2 2   Trouble relaxing 2 3 2   Restless 1 2 1   Easily annoyed or irritable 1 1 1   Afraid - awful might happen 2 1 2   Total GAD 7 Score 12 12 13        Relevant past medical, surgical, family and social history reviewed and updated as indicated. Interim medical history since our last visit reviewed. Allergies and medications reviewed and updated. Outpatient Medications Prior to Visit  Medication Sig Dispense Refill  . KETOCONAZOLE, TOPICAL, 1 % SHAM Apply topically  as directed to skin weekly then monthly PRN tinea versicolor 200 mL 1  . MAGNESIUM-ZINC PO Take 1 tablet by mouth daily.    . MULTIPLE VITAMIN PO Take 2 each by mouth daily. Gummies    . vitamin C (ASCORBIC ACID) 500 MG tablet Take 500 mg by mouth daily. emergenC    . buPROPion (WELLBUTRIN SR) 100 MG 12 hr tablet Take 1 tablet (100 mg total) by mouth daily. 30 tablet 3   No facility-administered medications prior to visit.     Per HPI unless specifically indicated in ROS section below Review of Systems Objective:    BP 112/70 (BP Location: Left Arm, Patient Position: Sitting, Cuff Size: Large)   Pulse 75   Temp 97.6 F (36.4 C) (Temporal)   Ht 6\' 1"  (1.854 m)   Wt 224 lb 5 oz (101.7 kg)   SpO2 98%   BMI 29.59 kg/m   Wt Readings from Last 3 Encounters:  10/31/19 224 lb 5 oz (101.7 kg)  09/29/19 217 lb (98.4 kg)  07/28/19 214 lb 2 oz (97.1 kg)    Physical Exam Vitals and nursing note reviewed.  Constitutional:  Appearance: Normal appearance. He is not ill-appearing.  Neurological:     Mental Status: He is alert.  Psychiatric:        Mood and Affect: Mood normal.        Behavior: Behavior normal.       Results for orders placed or performed in visit on 07/21/19  Testosterone  Result Value Ref Range   Testosterone 597.96 300.00 - 890.00 ng/dL  Comprehensive metabolic panel  Result Value Ref Range   Sodium 140 135 - 145 mEq/L   Potassium 4.4 3.5 - 5.1 mEq/L   Chloride 101 96 - 112 mEq/L   CO2 34 (H) 19 - 32 mEq/L   Glucose, Bld 92 70 - 99 mg/dL   BUN 13 6 - 23 mg/dL   Creatinine, Ser 1.18 0.40 - 1.50 mg/dL   Total Bilirubin 0.9 0.2 - 1.2 mg/dL   Alkaline Phosphatase 53 39 - 117 U/L   AST 29 0 - 37 U/L   ALT 22 0 - 53 U/L   Total Protein 7.1 6.0 - 8.3 g/dL   Albumin 4.4 3.5 - 5.2 g/dL   Calcium 9.5 8.4 - 10.5 mg/dL   GFR 71.01 >60.00 mL/min   Assessment & Plan:  This visit occurred during the SARS-CoV-2 public health emergency.  Safety protocols were in  place, including screening questions prior to the visit, additional usage of staff PPE, and extensive cleaning of exam room while observing appropriate contact time as indicated for disinfecting solutions.   Problem List Items Addressed This Visit    Inattention    Increase wellbutrin to 300mg  XL.  Reassess on higher dose.       Adjustment disorder with mixed anxiety and depressed mood - Primary    Increase wellbutrin XL to 300mg  daily. RTC 4-6 wks f/u visit Continue counseling.  Support provided.           Meds ordered this encounter  Medications  . DISCONTD: buPROPion (WELLBUTRIN XL) 150 MG 24 hr tablet    Sig: Take 1 tablet (150 mg total) by mouth daily.    Dispense:  30 tablet    Refill:  3  . buPROPion (WELLBUTRIN XL) 300 MG 24 hr tablet    Sig: Take 1 tablet (300 mg total) by mouth daily.    Dispense:  30 tablet    Refill:  3    Use this dose 300mg    No orders of the defined types were placed in this encounter.   Patient Instructions  Increase wellbutrin to 300mg  once daily in the morning.  Return in 4-6 weeks for follow up.    Follow up plan: Return in about 6 weeks (around 12/12/2019) for follow up visit.  Aaron Bush, MD

## 2019-10-31 NOTE — Patient Instructions (Addendum)
Increase wellbutrin to 300mg  once daily in the morning.  Return in 4-6 weeks for follow up.

## 2019-11-04 ENCOUNTER — Ambulatory Visit (INDEPENDENT_AMBULATORY_CARE_PROVIDER_SITE_OTHER): Payer: 59 | Admitting: Psychology

## 2019-11-04 DIAGNOSIS — F4321 Adjustment disorder with depressed mood: Secondary | ICD-10-CM | POA: Diagnosis not present

## 2019-11-11 ENCOUNTER — Ambulatory Visit (INDEPENDENT_AMBULATORY_CARE_PROVIDER_SITE_OTHER): Payer: 59 | Admitting: Psychology

## 2019-11-11 DIAGNOSIS — F4321 Adjustment disorder with depressed mood: Secondary | ICD-10-CM

## 2019-11-18 ENCOUNTER — Ambulatory Visit (INDEPENDENT_AMBULATORY_CARE_PROVIDER_SITE_OTHER): Payer: 59 | Admitting: Psychology

## 2019-11-18 DIAGNOSIS — F4321 Adjustment disorder with depressed mood: Secondary | ICD-10-CM

## 2019-11-25 ENCOUNTER — Ambulatory Visit (INDEPENDENT_AMBULATORY_CARE_PROVIDER_SITE_OTHER): Payer: 59 | Admitting: Psychology

## 2019-11-25 DIAGNOSIS — F4321 Adjustment disorder with depressed mood: Secondary | ICD-10-CM

## 2019-12-02 ENCOUNTER — Ambulatory Visit: Payer: 59 | Admitting: Psychology

## 2019-12-05 ENCOUNTER — Encounter: Payer: Self-pay | Admitting: Family Medicine

## 2019-12-05 ENCOUNTER — Ambulatory Visit (INDEPENDENT_AMBULATORY_CARE_PROVIDER_SITE_OTHER): Payer: Managed Care, Other (non HMO) | Admitting: Family Medicine

## 2019-12-05 ENCOUNTER — Other Ambulatory Visit: Payer: Self-pay

## 2019-12-05 DIAGNOSIS — F4323 Adjustment disorder with mixed anxiety and depressed mood: Secondary | ICD-10-CM

## 2019-12-05 DIAGNOSIS — R41840 Attention and concentration deficit: Secondary | ICD-10-CM | POA: Diagnosis not present

## 2019-12-05 MED ORDER — BUPROPION HCL ER (XL) 300 MG PO TB24: 300.0000 mg | ORAL_TABLET | Freq: Every day | ORAL | 4 refills | Status: DC

## 2019-12-05 NOTE — Assessment & Plan Note (Signed)
Finds wellbutrin has helped ability to focus at work.

## 2019-12-05 NOTE — Assessment & Plan Note (Signed)
Continue wellbutrin XL 300mg  daily.  Support provided. He may consider trial separation period.  RTC 3 mo f/u visit.

## 2019-12-05 NOTE — Patient Instructions (Addendum)
Continue wellbutrin. Return as needed or in 3 months for follow up visit.

## 2019-12-05 NOTE — Progress Notes (Signed)
This visit was conducted in person.  BP 122/80 (BP Location: Left Arm, Patient Position: Sitting, Cuff Size: Normal)   Pulse 67   Temp 97.8 F (36.6 C) (Temporal)   Ht 6\' 1"  (1.854 m)   Wt 223 lb 9 oz (101.4 kg)   SpO2 99%   BMI 29.50 kg/m   BP Readings from Last 3 Encounters:  12/05/19 122/80  10/31/19 112/70  09/29/19 124/78    CC: 1 mo mood f/u Subjective:    Patient ID: [id number], male    DOB: 1986/02/02, 34 y.o.   MRN: [id number]  HPI: Aaron Pugh is a 34 y.o. male presenting on 12/05/2019 for Follow-up (Here for mood f/u.)   See prior note for details.  2 months ago we started wellbutrin for adjustment disorder with mixed mood, ?SAD, positive screen for ADD. Currently taking wellbutrin XL 300mg  once daily. Hasn't noticed much change but overall feels mood is improving. Continues seeing counselor - Q2 wks. Finds that wellbutrin helps focus at work.   Depression screen [facility] 2/9 12/05/2019 10/31/2019 09/29/2019 07/28/2019 05/31/2018  Decreased Interest 2 1 1 2  0  Down, Depressed, Hopeless 2 - 1 3 0  PHQ - 2 Score 4 1 2 5  0  Altered sleeping 0 1 1 0 -  Tired, decreased energy 2 2 3 2  -  Change in appetite 3 2 2 1  -  Feeling bad or failure about yourself  1 1 2 2  -  Trouble concentrating 1 2 3 3  -  Moving slowly or fidgety/restless 0 0 1 0 -  Suicidal thoughts 0 0 0 0 -  PHQ-9 Score 11 9 14 13  -    GAD 7 : Generalized Anxiety Score 12/05/2019 10/31/2019 09/29/2019 07/28/2019  Nervous, Anxious, on Edge 2 2 2 2   Control/stop worrying 1 2 1 3   Worry too much - different things 1 2 2 2   Trouble relaxing 1 2 3 2   Restless 1 1 2 1   Easily annoyed or irritable 2 1 1 1   Afraid - awful might happen 1 2 1 2   Total GAD 7 Score 9 12 12 13        Relevant past medical, surgical, family and social history reviewed and updated as indicated. Interim medical history since our last visit reviewed. Allergies and medications reviewed and updated. Outpatient Medications Prior to Visit    Medication Sig Dispense Refill  . KETOCONAZOLE, TOPICAL, 1 % SHAM Apply topically as directed to skin weekly then monthly PRN tinea versicolor 200 mL 1  . MAGNESIUM-ZINC PO Take 1 tablet by mouth daily.    . MULTIPLE VITAMIN PO Take 2 each by mouth daily. Gummies    . vitamin C (ASCORBIC ACID) 500 MG tablet Take 500 mg by mouth daily. emergenC    . buPROPion (WELLBUTRIN XL) 300 MG 24 hr tablet Take 1 tablet (300 mg total) by mouth daily. 30 tablet 3   No facility-administered medications prior to visit.     Per HPI unless specifically indicated in ROS section below Review of Systems Objective:    BP 122/80 (BP Location: Left Arm, Patient Position: Sitting, Cuff Size: Normal)   Pulse 67   Temp 97.8 F (36.6 C) (Temporal)   Ht 6\' 1"  (1.854 m)   Wt 223 lb 9 oz (101.4 kg)   SpO2 99%   BMI 29.50 kg/m   Wt Readings from Last 3 Encounters:  12/05/19 223 lb 9 oz (101.4 kg)  10/31/19 224 lb 5 oz (101.7  kg)  09/29/19 217 lb (98.4 kg)    Physical Exam Vitals and nursing note reviewed.  Constitutional:      Appearance: Normal appearance. He is not ill-appearing.  Cardiovascular:     Rate and Rhythm: Normal rate and regular rhythm.     Pulses: Normal pulses.     Heart sounds: Normal heart sounds. No murmur.  Pulmonary:     Effort: Pulmonary effort is normal. No respiratory distress.     Breath sounds: Normal breath sounds. No wheezing, rhonchi or rales.  Musculoskeletal:     Right lower leg: No edema.     Left lower leg: No edema.  Skin:    General: Skin is warm and dry.     Findings: No rash.  Neurological:     Mental Status: He is alert.  Psychiatric:        Mood and Affect: Mood normal.        Behavior: Behavior normal.       Results for orders placed or performed in visit on 07/21/19  Testosterone  Result Value Ref Range   Testosterone 597.96 300.00 - 890.00 ng/dL  Comprehensive metabolic panel  Result Value Ref Range   Sodium 140 135 - 145 mEq/L   Potassium 4.4  3.5 - 5.1 mEq/L   Chloride 101 96 - 112 mEq/L   CO2 34 (H) 19 - 32 mEq/L   Glucose, Bld 92 70 - 99 mg/dL   BUN 13 6 - 23 mg/dL   Creatinine, Ser 1.18 0.40 - 1.50 mg/dL   Total Bilirubin 0.9 0.2 - 1.2 mg/dL   Alkaline Phosphatase 53 39 - 117 U/L   AST 29 0 - 37 U/L   ALT 22 0 - 53 U/L   Total Protein 7.1 6.0 - 8.3 g/dL   Albumin 4.4 3.5 - 5.2 g/dL   Calcium 9.5 8.4 - 10.5 mg/dL   GFR 71.01 >60.00 mL/min   Assessment & Plan:  This visit occurred during the SARS-CoV-2 public health emergency.  Safety protocols were in place, including screening questions prior to the visit, additional usage of staff PPE, and extensive cleaning of exam room while observing appropriate contact time as indicated for disinfecting solutions.   Problem List Items Addressed This Visit    Inattention    Finds wellbutrin has helped ability to focus at work.       Adjustment disorder with mixed anxiety and depressed mood    Continue wellbutrin XL 300mg  daily.  Support provided. He may consider trial separation period.  RTC 3 mo f/u visit.           Meds ordered this encounter  Medications  . buPROPion (WELLBUTRIN XL) 300 MG 24 hr tablet    Sig: Take 1 tablet (300 mg total) by mouth daily.    Dispense:  30 tablet    Refill:  4    Use this dose 300mg    No orders of the defined types were placed in this encounter.   Patient Instructions  Continue wellbutrin. Return as needed or in 3 months for follow up visit.    Follow up plan: Return in about 3 months (around 03/06/2020), or if symptoms worsen or fail to improve, for follow up visit.  [name], MD

## 2019-12-09 ENCOUNTER — Ambulatory Visit (INDEPENDENT_AMBULATORY_CARE_PROVIDER_SITE_OTHER): Payer: 59 | Admitting: Psychology

## 2019-12-09 DIAGNOSIS — F4321 Adjustment disorder with depressed mood: Secondary | ICD-10-CM

## 2019-12-16 ENCOUNTER — Ambulatory Visit: Payer: 59 | Admitting: Psychology

## 2020-01-06 ENCOUNTER — Ambulatory Visit: Payer: 59 | Admitting: Psychology

## 2020-01-20 ENCOUNTER — Ambulatory Visit (INDEPENDENT_AMBULATORY_CARE_PROVIDER_SITE_OTHER): Payer: 59 | Admitting: Psychology

## 2020-01-20 DIAGNOSIS — F4321 Adjustment disorder with depressed mood: Secondary | ICD-10-CM | POA: Diagnosis not present

## 2020-02-03 ENCOUNTER — Ambulatory Visit (INDEPENDENT_AMBULATORY_CARE_PROVIDER_SITE_OTHER): Payer: 59 | Admitting: Psychology

## 2020-02-03 DIAGNOSIS — F4321 Adjustment disorder with depressed mood: Secondary | ICD-10-CM | POA: Diagnosis not present

## 2020-02-17 ENCOUNTER — Ambulatory Visit: Payer: 59 | Admitting: Psychology

## 2020-03-02 ENCOUNTER — Ambulatory Visit (INDEPENDENT_AMBULATORY_CARE_PROVIDER_SITE_OTHER): Payer: 59 | Admitting: Psychology

## 2020-03-02 ENCOUNTER — Ambulatory Visit: Payer: 59 | Admitting: Psychology

## 2020-03-02 DIAGNOSIS — F4321 Adjustment disorder with depressed mood: Secondary | ICD-10-CM | POA: Diagnosis not present

## 2020-03-06 ENCOUNTER — Ambulatory Visit (INDEPENDENT_AMBULATORY_CARE_PROVIDER_SITE_OTHER): Payer: Managed Care, Other (non HMO) | Admitting: Family Medicine

## 2020-03-06 ENCOUNTER — Encounter: Payer: Self-pay | Admitting: Family Medicine

## 2020-03-06 ENCOUNTER — Other Ambulatory Visit: Payer: Self-pay

## 2020-03-06 VITALS — BP 118/76 | HR 65 | Temp 98.2°F | Ht 73.0 in | Wt 216.1 lb

## 2020-03-06 DIAGNOSIS — R41840 Attention and concentration deficit: Secondary | ICD-10-CM | POA: Diagnosis not present

## 2020-03-06 DIAGNOSIS — M545 Low back pain, unspecified: Secondary | ICD-10-CM | POA: Insufficient documentation

## 2020-03-06 DIAGNOSIS — F4323 Adjustment disorder with mixed anxiety and depressed mood: Secondary | ICD-10-CM | POA: Diagnosis not present

## 2020-03-06 DIAGNOSIS — M5442 Lumbago with sciatica, left side: Secondary | ICD-10-CM | POA: Diagnosis not present

## 2020-03-06 DIAGNOSIS — Z113 Encounter for screening for infections with a predominantly sexual mode of transmission: Secondary | ICD-10-CM | POA: Diagnosis not present

## 2020-03-06 MED ORDER — CYCLOBENZAPRINE HCL 10 MG PO TABS: 5.0000 mg | ORAL_TABLET | Freq: Three times a day (TID) | ORAL | 0 refills | Status: DC | PRN

## 2020-03-06 NOTE — Assessment & Plan Note (Addendum)
Screened positive for ADD 09/2019 - would be interested in stimulant trial - will refer for formal ADD testing, return afterwards.

## 2020-03-06 NOTE — Progress Notes (Signed)
This visit was conducted in person.  BP 118/76 (BP Location: Left Arm, Patient Position: Sitting, Cuff Size: Normal)   Pulse 65   Temp 98.2 F (36.8 C) (Temporal)   Ht 6\' 1"  (6.387 m)   Wt 216 lb 1 oz (98 kg)   SpO2 98%   BMI 28.51 kg/m    CC: 3 mo f/u visit  Subjective:    Patient ID: Aaron Pugh, male    DOB: [birth year], 34 y.o.   MRN: [id number]  HPI: Aaron Pugh is a 34 y.o. male presenting on 03/06/2020 for Mood (Here for 3 mo MDD/GAD f/u.)   See prior note for details.  Stopped wellbutrin XL 300mg  - didn't feel this was really helping. Thought adjustment disorder with mixed mood, ?SAD - continues seeing counselor Dennison Bulla). Planning to see counselor through [employer]. Felt wellbutrin helped focus but didn't really need this for mood.  Back in January did screen positive for ADHD - inattention. Notes chaotic mind. Wonders about stimulant trial.   Currently undergoing trial separation from wife for the last 2 months. Now has his own apartment. Continues seen family/children regularly.   Found father died recently - he was estranged.   Requests STD screen. Multiple partners in the past 2 months, not using protection 100%. Denies dysuria, urethral discharge, groin rash.   A few weeks ago hurt left lower back in gym while doing power cleans - explosive weighted movements. Did not rest back afterwards with continued strenuous exercises until pain worsened. Describes left back pain sharp throbbing with radiation down posterior leg to knee. No numbness/weakness of leg, bowel/bladder changes, fevers, saddle anesthesia. Now over last 1-2 weeks has not been back to the gym.      Relevant past medical, surgical, family and social history reviewed and updated as indicated. Interim medical history since our last visit reviewed. Allergies and medications reviewed and updated. Outpatient Medications Prior to Visit  Medication Sig Dispense Refill  . buPROPion  (WELLBUTRIN XL) 300 MG 24 hr tablet Take 1 tablet (300 mg total) by mouth daily. 30 tablet 4  . KETOCONAZOLE, TOPICAL, 1 % SHAM Apply topically as directed to skin weekly then monthly PRN tinea versicolor 200 mL 1  . MAGNESIUM-ZINC PO Take 1 tablet by mouth daily.    . MULTIPLE VITAMIN PO Take 2 each by mouth daily. Gummies    . vitamin C (ASCORBIC ACID) 500 MG tablet Take 500 mg by mouth daily. emergenC     No facility-administered medications prior to visit.     Per HPI unless specifically indicated in ROS section below Review of Systems Objective:  BP 118/76 (BP Location: Left Arm, Patient Position: Sitting, Cuff Size: Normal)   Pulse 65   Temp 98.2 F (36.8 C) (Temporal)   Ht 6\' 1"  (1.854 m)   Wt 216 lb 1 oz (98 kg)   SpO2 98%   BMI 28.51 kg/m   Wt Readings from Last 3 Encounters:  03/06/20 216 lb 1 oz (98 kg)  12/05/19 223 lb 9 oz (101.4 kg)  10/31/19 224 lb 5 oz (101.7 kg)      Physical Exam Vitals and nursing note reviewed.  Constitutional:      Appearance: Normal appearance. He is not ill-appearing.  HENT:     Head: Normocephalic and atraumatic.  Cardiovascular:     Heart sounds: Normal heart sounds.  Musculoskeletal:     Right lower leg: No edema.     Left lower leg: No edema.  Comments:  No pain midline spine No paraspinous mm tenderness Painful with SLR L>R.   No pain with int/ext rotation at hip. Neg FABER. No pain at SIJ, GTB or sciatic notch bilaterally.  Discomfort at L inner buttock, no pain to palpation along hamstring   Skin:    General: Skin is warm and dry.     Findings: No rash.  Neurological:     Mental Status: He is alert.     Sensory: Sensation is intact.     Motor: Motor function is intact.     Deep Tendon Reflexes:     Reflex Scores:      Patellar reflexes are 2+ on the right side and 2+ on the left side.    Comments:  5/5 strength BLE Able to heel and toe walk 2+ patellar DTRs  Psychiatric:        Mood and Affect: Mood normal.         Behavior: Behavior normal.       Depression screen [facility] 2/9 03/06/2020 12/05/2019 10/31/2019 09/29/2019 07/28/2019  Decreased Interest 1 2 1 1 2   Down, Depressed, Hopeless 1 2 - 1 3  PHQ - 2 Score 2 4 1 2 5   Altered sleeping 1 0 1 1 0  Tired, decreased energy 2 2 2 3 2   Change in appetite 2 3 2 2 1   Feeling bad or failure about yourself  2 1 1 2 2   Trouble concentrating 3 1 2 3 3   Moving slowly or fidgety/restless 1 0 0 1 0  Suicidal thoughts 0 0 0 0 0  PHQ-9 Score 13 11 9 14 13     GAD 7 : Generalized Anxiety Score 03/06/2020 12/05/2019 10/31/2019 09/29/2019  Nervous, Anxious, on Edge 2 2 2 2   Control/stop worrying 1 1 2 1   Worry too much - different things 2 1 2 2   Trouble relaxing 3 1 2 3   Restless 3 1 1 2   Easily annoyed or irritable 2 2 1 1   Afraid - awful might happen 2 1 2 1   Total GAD 7 Score 15 9 12 12    Assessment & Plan:  This visit occurred during the SARS-CoV-2 public health emergency.  Safety protocols were in place, including screening questions prior to the visit, additional usage of staff PPE, and extensive cleaning of exam room while observing appropriate contact time as indicated for disinfecting solutions.   Problem List Items Addressed This Visit    Left low back pain    Acute injury a few wks ago while working out. ?lumbar strain or possible gluteal strain. No radiculopathy but he does have radicular pain down posterior buttock into thigh. Ongoing pain. No red flags. Supportive care reviewed with ongoing NSAID, heating pad, etc - will add flexeril muscle relaxant with sedation precautions. Low back exercises provided today. rec sports med f/u if not improving with this.       Relevant Medications   cyclobenzaprine (FLEXERIL) 10 MG tablet   Inattention - Primary    Screened positive for ADD 09/2019 - would be interested in stimulant trial - will refer for formal ADD testing, return afterwards.       Relevant Orders   Ambulatory referral to Psychology    Adjustment disorder with mixed anxiety and depressed mood    Has self tapered of wellbutrin with stable mood. He feels trial separation from wife has helped mood some as well as better weather. Continues seeing family. Considering second opinion with different counselor.  Relevant Orders   Ambulatory referral to Psychology    Other Visit Diagnoses    Screen for STD (sexually transmitted disease)       Relevant Orders   HIV Antibody (routine testing w rflx)   RPR   C. trachomatis/N. gonorrhoeae RNA       Meds ordered this encounter  Medications  . cyclobenzaprine (FLEXERIL) 10 MG tablet    Sig: Take 0.5-1 tablets (5-10 mg total) by mouth 3 (three) times daily as needed for muscle spasms (sedation precautions).    Dispense:  40 tablet    Refill:  0   Orders Placed This Encounter  Procedures  . C. trachomatis/N. gonorrhoeae RNA  . HIV Antibody (routine testing w rflx)  . RPR  . Ambulatory referral to Psychology    Referral Priority:   Routine    Referral Type:   Psychiatric    Referral Reason:   Specialty Services Required    Requested Specialty:   Psychology    Number of Visits Requested:   1    Patient Instructions  STD screen today We will refer you for ADD testing.  For back - likely lumbar strain or muscle strain - treat with continued ibuprofen, add flexeril muscle relaxant 5-10mg  twice daily as needed (with sedation precautions), heating pad, do stretching exercises provided today. Rest the back over the next 1-2 weeks. Let us know if not improving to schedule sports medicine evaluation.    Follow up plan: Return in about 3 months (around 06/06/2020) for follow up visit.  Eustaquio Boyden, MD

## 2020-03-06 NOTE — Assessment & Plan Note (Addendum)
Acute injury a few wks ago while working out. ?lumbar strain or possible gluteal strain. No radiculopathy but he does have radicular pain down posterior buttock into thigh. Ongoing pain. No red flags. Supportive care reviewed with ongoing NSAID, heating pad, etc - will add flexeril muscle relaxant with sedation precautions. Low back exercises provided today. rec sports med f/u if not improving with this.

## 2020-03-06 NOTE — Assessment & Plan Note (Addendum)
Has self tapered of wellbutrin with stable mood. He feels trial separation from wife has helped mood some as well as better weather. Continues seeing family. Considering second opinion with different counselor.

## 2020-03-06 NOTE — Patient Instructions (Addendum)
STD screen today We will refer you for ADD testing.  For back - likely lumbar strain or muscle strain - treat with continued ibuprofen, add flexeril muscle relaxant 5-10mg  twice daily as needed (with sedation precautions), heating pad, do stretching exercises provided today. Rest the back over the next 1-2 weeks. Let us know if not improving to schedule sports medicine evaluation.

## 2020-03-07 LAB — C. TRACHOMATIS/N. GONORRHOEAE RNA
C. trachomatis RNA, TMA: NOT DETECTED
N. gonorrhoeae RNA, TMA: NOT DETECTED

## 2020-03-07 LAB — RPR: RPR Ser Ql: NONREACTIVE

## 2020-03-07 LAB — HIV ANTIBODY (ROUTINE TESTING W REFLEX): HIV 1&2 Ab, 4th Generation: NONREACTIVE

## 2020-03-16 ENCOUNTER — Ambulatory Visit (INDEPENDENT_AMBULATORY_CARE_PROVIDER_SITE_OTHER): Payer: 59 | Admitting: Psychology

## 2020-03-16 ENCOUNTER — Ambulatory Visit: Payer: 59 | Admitting: Psychology

## 2020-03-16 DIAGNOSIS — F4321 Adjustment disorder with depressed mood: Secondary | ICD-10-CM | POA: Diagnosis not present

## 2020-03-30 ENCOUNTER — Ambulatory Visit (INDEPENDENT_AMBULATORY_CARE_PROVIDER_SITE_OTHER): Payer: 59 | Admitting: Psychology

## 2020-03-30 ENCOUNTER — Ambulatory Visit: Payer: 59 | Admitting: Psychology

## 2020-03-30 DIAGNOSIS — F4321 Adjustment disorder with depressed mood: Secondary | ICD-10-CM

## 2020-04-13 ENCOUNTER — Ambulatory Visit: Payer: 59 | Admitting: Psychology

## 2020-04-27 ENCOUNTER — Ambulatory Visit: Payer: 59 | Admitting: Psychology

## 2020-05-11 ENCOUNTER — Ambulatory Visit: Payer: 59 | Admitting: Psychology

## 2020-05-11 ENCOUNTER — Ambulatory Visit (INDEPENDENT_AMBULATORY_CARE_PROVIDER_SITE_OTHER): Payer: 59 | Admitting: Psychology

## 2020-05-11 DIAGNOSIS — F4321 Adjustment disorder with depressed mood: Secondary | ICD-10-CM | POA: Diagnosis not present

## 2020-05-25 ENCOUNTER — Ambulatory Visit: Payer: 59 | Admitting: Psychology

## 2020-06-06 ENCOUNTER — Encounter: Payer: Self-pay | Admitting: Family Medicine

## 2020-06-06 ENCOUNTER — Ambulatory Visit (INDEPENDENT_AMBULATORY_CARE_PROVIDER_SITE_OTHER): Payer: Managed Care, Other (non HMO) | Admitting: Family Medicine

## 2020-06-06 ENCOUNTER — Other Ambulatory Visit: Payer: Self-pay

## 2020-06-06 VITALS — BP 120/66 | HR 73 | Temp 98.4°F | Ht 73.0 in | Wt 216.4 lb

## 2020-06-06 DIAGNOSIS — Z113 Encounter for screening for infections with a predominantly sexual mode of transmission: Secondary | ICD-10-CM | POA: Diagnosis not present

## 2020-06-06 DIAGNOSIS — F4323 Adjustment disorder with mixed anxiety and depressed mood: Secondary | ICD-10-CM

## 2020-06-06 DIAGNOSIS — R41840 Attention and concentration deficit: Secondary | ICD-10-CM | POA: Diagnosis not present

## 2020-06-06 DIAGNOSIS — Z23 Encounter for immunization: Secondary | ICD-10-CM | POA: Diagnosis not present

## 2020-06-06 MED ORDER — KETOCONAZOLE 1 % EX SHAM: MEDICATED_SHAMPOO | CUTANEOUS | 1 refills | Status: DC

## 2020-06-06 NOTE — Progress Notes (Signed)
This visit was conducted in person.  BP 120/66 (BP Location: Left Arm, Patient Position: Sitting, Cuff Size: Large)   Pulse 73   Temp 98.4 F (36.9 C) (Temporal)   Ht 6\' 1"  (1.854 m)   Wt 216 lb 6 oz (98.1 kg)   SpO2 97%   BMI 28.55 kg/m    CC: 3 mo f/u visit  Subjective:    Patient ID: Aaron Pugh, male    DOB: 08/01/1986, 34 y.o.   MRN: [id number]  HPI: Aaron Pugh is a 34 y.o. male presenting on 06/06/2020 for Follow-up (Here for 3 mo mood f/u.)   See prior note for details.  Off mood medication over the past several months.  We referred for formal ADHD evaluation last visit - upcoming apt Oct 6 with Dr Oct 8 3pm.   Ongoing separation from wife since 12/2019.   Notes some cycling of highs and lows. No manic episodes. Does have episodes of actions without thinking consequences - impulsivity.  Ongoing stressors with relationship with family, at work.   Seeing new counselor through 01/2020.   New bumps on penis. Thinks friction irritation. Not blisters. Requests STD testing. Multiple sexual partners. Not using protection 100%.      Relevant past medical, surgical, family and social history reviewed and updated as indicated. Interim medical history since our last visit reviewed. Allergies and medications reviewed and updated. Outpatient Medications Prior to Visit  Medication Sig Dispense Refill  . cyclobenzaprine (FLEXERIL) 10 MG tablet Take 0.5-1 tablets (5-10 mg total) by mouth 3 (three) times daily as needed for muscle spasms (sedation precautions). 40 tablet 0  . MAGNESIUM-ZINC PO Take 1 tablet by mouth daily.    . MULTIPLE VITAMIN PO Take 2 each by mouth daily. Gummies    . vitamin C (ASCORBIC ACID) 500 MG tablet Take 500 mg by mouth daily. emergenC    . KETOCONAZOLE, TOPICAL, 1 % SHAM Apply topically as directed to skin weekly then monthly PRN tinea versicolor 200 mL 1  . buPROPion (WELLBUTRIN XL) 300 MG 24 hr tablet Take 1 tablet (300 mg total) by mouth  daily. (Patient not taking: Reported on 06/06/2020) 30 tablet 4   No facility-administered medications prior to visit.     Per HPI unless specifically indicated in ROS section below Review of Systems Objective:  BP 120/66 (BP Location: Left Arm, Patient Position: Sitting, Cuff Size: Large)   Pulse 73   Temp 98.4 F (36.9 C) (Temporal)   Ht 6\' 1"  (1.854 m)   Wt 216 lb 6 oz (98.1 kg)   SpO2 97%   BMI 28.55 kg/m   Wt Readings from Last 3 Encounters:  06/06/20 216 lb 6 oz (98.1 kg)  03/06/20 216 lb 1 oz (98 kg)  12/05/19 223 lb 9 oz (101.4 kg)      Physical Exam Vitals and nursing note reviewed.  Constitutional:      Appearance: Normal appearance. He is not ill-appearing.  Cardiovascular:     Rate and Rhythm: Normal rate and regular rhythm.     Pulses: Normal pulses.     Heart sounds: Normal heart sounds. No murmur heard.   Pulmonary:     Effort: Pulmonary effort is normal. No respiratory distress.     Breath sounds: Normal breath sounds. No wheezing, rhonchi or rales.  Genitourinary:    Penis: Normal.      Testes: Normal.     Comments: Few irritated papules on left lateral shaft - not vesicular Neurological:  Mental Status: He is alert.  Psychiatric:        Mood and Affect: Mood normal.        Behavior: Behavior normal.       Results for orders placed or performed in visit on 06/06/20  C. trachomatis/N. gonorrhoeae RNA   Specimen: Blood  Result Value Ref Range   C. trachomatis RNA, TMA NOT DETECTED NOT DETECT   N. gonorrhoeae RNA, TMA NOT DETECTED NOT DETECT  HIV Antibody (routine testing w rflx)  Result Value Ref Range   HIV 1&2 Ab, 4th Generation NON-REACTIVE NON-REACTI  RPR  Result Value Ref Range   RPR Ser Ql NON-REACTIVE NON-REACTI   Depression screen [facility] 2/9 06/06/2020 03/06/2020 12/05/2019 10/31/2019 09/29/2019  Decreased Interest 2 1 2 1 1   Down, Depressed, Hopeless 2 1 2  - 1  PHQ - 2 Score 4 2 4 1 2   Altered sleeping 1 1 0 1 1  Tired, decreased energy  1 2 2 2 3   Change in appetite 1 2 3 2 2   Feeling bad or failure about yourself  2 2 1 1 2   Trouble concentrating 3 3 1 2 3   Moving slowly or fidgety/restless 1 1 0 0 1  Suicidal thoughts 0 0 0 0 0  PHQ-9 Score 13 13 11 9 14     GAD 7 : Generalized Anxiety Score 06/06/2020 03/06/2020 12/05/2019 10/31/2019  Nervous, Anxious, on Edge 1 2 2 2   Control/stop worrying 2 1 1 2   Worry too much - different things 2 2 1 2   Trouble relaxing 2 3 1 2   Restless 2 3 1 1   Easily annoyed or irritable 1 2 2 1   Afraid - awful might happen 2 2 1 2   Total GAD 7 Score 12 15 9 12    Assessment & Plan:  This visit occurred during the SARS-CoV-2 public health emergency.  Safety protocols were in place, including screening questions prior to the visit, additional usage of staff PPE, and extensive cleaning of exam room while observing appropriate contact time as indicated for disinfecting solutions.   Problem List Items Addressed This Visit    Inattention    Awaiting ADHD evaluation (Altabet) next month Advised return after eval.        Adjustment disorder with mixed anxiety and depressed mood - Primary    Ongoing difficulty. Encouraged continued counseling but don't recommend medication at this time. ?inattention contribution pending formal psychological testing. MDQ screen negative.        Other Visit Diagnoses    Screen for STD (sexually transmitted disease)       Relevant Orders   HIV Antibody (routine testing w rflx) (Completed)   RPR (Completed)   C. trachomatis/N. gonorrhoeae RNA (Completed)   HSV Type I/II IgG, IgMw/ reflex   Need for influenza vaccination       Relevant Orders   Flu Vaccine QUAD 36+ mos IM (Completed)       Meds ordered this encounter  Medications  . KETOCONAZOLE, TOPICAL, 1 % SHAM    Sig: Apply topically as directed to skin weekly then monthly PRN tinea versicolor    Dispense:  200 mL    Refill:  1    Use this dose   Orders Placed This Encounter  Procedures  . C.  trachomatis/N. gonorrhoeae RNA  . Flu Vaccine QUAD 36+ mos IM  . HIV Antibody (routine testing w rflx)  . RPR  . HSV Type I/II IgG, IgMw/ reflex    Patient  Instructions  Flu shot today.  Labs and urine test today.  Will await Dr Altabet's ADHD evaluation.  No medications at this time - will await full evaluation.    Follow up plan: Return if symptoms worsen or fail to improve.  Eustaquio Boyden, MD

## 2020-06-06 NOTE — Patient Instructions (Addendum)
Flu shot today.  Labs and urine test today.  Will await Dr Altabet's ADHD evaluation.  No medications at this time - will await full evaluation.

## 2020-06-07 LAB — C. TRACHOMATIS/N. GONORRHOEAE RNA
C. trachomatis RNA, TMA: NOT DETECTED
N. gonorrhoeae RNA, TMA: NOT DETECTED

## 2020-06-07 LAB — HIV ANTIBODY (ROUTINE TESTING W REFLEX): HIV 1&2 Ab, 4th Generation: NONREACTIVE

## 2020-06-07 LAB — RPR: RPR Ser Ql: NONREACTIVE

## 2020-06-08 ENCOUNTER — Ambulatory Visit: Payer: 59 | Admitting: Psychology

## 2020-06-09 NOTE — Assessment & Plan Note (Signed)
Ongoing difficulty. Encouraged continued counseling but don't recommend medication at this time. ?inattention contribution pending formal psychological testing. MDQ screen negative.

## 2020-06-09 NOTE — Assessment & Plan Note (Addendum)
Awaiting ADHD evaluation (Altabet) next month Advised return after eval.

## 2020-06-22 ENCOUNTER — Ambulatory Visit (INDEPENDENT_AMBULATORY_CARE_PROVIDER_SITE_OTHER): Payer: 59 | Admitting: Psychology

## 2020-06-22 DIAGNOSIS — F4321 Adjustment disorder with depressed mood: Secondary | ICD-10-CM | POA: Diagnosis not present

## 2020-06-25 LAB — SPECIMEN STATUS REPORT

## 2020-06-26 ENCOUNTER — Other Ambulatory Visit: Payer: Self-pay | Admitting: Family Medicine

## 2020-06-26 DIAGNOSIS — Z113 Encounter for screening for infections with a predominantly sexual mode of transmission: Secondary | ICD-10-CM

## 2020-06-27 ENCOUNTER — Ambulatory Visit (INDEPENDENT_AMBULATORY_CARE_PROVIDER_SITE_OTHER): Payer: 59 | Admitting: Psychology

## 2020-06-27 DIAGNOSIS — F341 Dysthymic disorder: Secondary | ICD-10-CM | POA: Diagnosis not present

## 2020-06-27 DIAGNOSIS — F411 Generalized anxiety disorder: Secondary | ICD-10-CM | POA: Diagnosis not present

## 2020-06-28 ENCOUNTER — Other Ambulatory Visit: Payer: Managed Care, Other (non HMO)

## 2020-06-28 ENCOUNTER — Other Ambulatory Visit: Payer: Self-pay

## 2020-06-28 DIAGNOSIS — Z113 Encounter for screening for infections with a predominantly sexual mode of transmission: Secondary | ICD-10-CM

## 2020-07-03 LAB — HSV(HERPES SIMPLEX VRS) I + II AB-IGG
HAV 1 IGG,TYPE SPECIFIC AB: <0.9 index
HSV 2 IGG,TYPE SPECIFIC AB: <0.9 index

## 2020-07-03 LAB — HSV 1/2 AB (IGM), IFA W/RFLX TITER
HSV 1 IgM Screen: NEGATIVE
HSV 2 IgM Screen: NEGATIVE

## 2020-07-06 ENCOUNTER — Ambulatory Visit (INDEPENDENT_AMBULATORY_CARE_PROVIDER_SITE_OTHER): Payer: 59 | Admitting: Psychology

## 2020-07-06 DIAGNOSIS — F4321 Adjustment disorder with depressed mood: Secondary | ICD-10-CM

## 2020-07-20 ENCOUNTER — Ambulatory Visit (INDEPENDENT_AMBULATORY_CARE_PROVIDER_SITE_OTHER): Payer: 59 | Admitting: Psychology

## 2020-07-20 DIAGNOSIS — F4321 Adjustment disorder with depressed mood: Secondary | ICD-10-CM | POA: Diagnosis not present

## 2020-08-03 ENCOUNTER — Ambulatory Visit: Payer: 59 | Admitting: Psychology

## 2020-08-17 ENCOUNTER — Ambulatory Visit: Payer: 59 | Admitting: Psychology

## 2020-08-31 ENCOUNTER — Ambulatory Visit (INDEPENDENT_AMBULATORY_CARE_PROVIDER_SITE_OTHER): Payer: 59 | Admitting: Psychology

## 2020-08-31 DIAGNOSIS — F4321 Adjustment disorder with depressed mood: Secondary | ICD-10-CM | POA: Diagnosis not present

## 2020-09-22 DIAGNOSIS — U071 COVID-19: Secondary | ICD-10-CM

## 2020-09-22 HISTORY — DX: COVID-19: U07.1

## 2020-09-25 ENCOUNTER — Telehealth (INDEPENDENT_AMBULATORY_CARE_PROVIDER_SITE_OTHER): Payer: Managed Care, Other (non HMO) | Admitting: Family Medicine

## 2020-09-25 ENCOUNTER — Other Ambulatory Visit: Payer: Managed Care, Other (non HMO)

## 2020-09-25 ENCOUNTER — Other Ambulatory Visit (INDEPENDENT_AMBULATORY_CARE_PROVIDER_SITE_OTHER): Payer: Managed Care, Other (non HMO) | Admitting: Family Medicine

## 2020-09-25 ENCOUNTER — Encounter: Payer: Self-pay | Admitting: Family Medicine

## 2020-09-25 VITALS — BP 128/73 | HR 72 | Wt 205.0 lb

## 2020-09-25 DIAGNOSIS — J029 Acute pharyngitis, unspecified: Secondary | ICD-10-CM | POA: Diagnosis not present

## 2020-09-25 DIAGNOSIS — J069 Acute upper respiratory infection, unspecified: Secondary | ICD-10-CM | POA: Diagnosis not present

## 2020-09-25 LAB — POCT RAPID STREP A (OFFICE): Rapid Strep A Screen: NEGATIVE

## 2020-09-25 LAB — POC INFLUENZA A&B (BINAX/QUICKVUE)
Influenza A, POC: NEGATIVE
Influenza B, POC: NEGATIVE

## 2020-09-25 MED ORDER — KETOCONAZOLE 1 % EX SHAM: MEDICATED_SHAMPOO | CUTANEOUS | 0 refills | Status: AC

## 2020-09-25 NOTE — Assessment & Plan Note (Signed)
Ordered swabs for covid and flu and strep to drive through  Has had 2 vaccines for covid but not booster  Has had flu shot   Adv fluids and rest  otc symptom tx- adv guaifenesin to help congestion  Ibuprofen for fever prn  inst to call if symptoms worsen-esp if he develops sob  Pending labs

## 2020-09-25 NOTE — Patient Instructions (Signed)
Drink fluids and rest The office will call you to set up a drive through appt for swabs (covid and flu and strep) Isolate yourself until we get results   Over the counter expectorant may help with mucous/phlegm  If your symptoms suddenly worsen or you develop shortness of breath let us know and get to ER if needed

## 2020-09-25 NOTE — Assessment & Plan Note (Signed)
With some uri symptoms  He has young children at home Strep swab added to tests ordered Recommend fluids and hot drinks if helpful

## 2020-09-25 NOTE — Progress Notes (Signed)
Virtual Visit via Video Note  I connected with Aaron Pugh on 09/25/20 at 11:30 AM EST by a video enabled telemedicine application and verified that I am speaking with the correct person using two identifiers.  Location: Patient: home Provider: office   I discussed the limitations of evaluation and management by telemedicine and the availability of in person appointments. The patient expressed understanding and agreed to proceed.  Parties involved in encounter  Patient: Aaron Pugh Surgeon  Provider:  Roxy Manns MD   Video did not work out today on the part of the patient  The visit was then conducted by phone   History of Present Illness: 35 yo pt of Dr Reece Agar presents with uri symptoms   Friday-chilled in the evening and headache -went to bed   Saturday with HA and fever (sweats)  No thermometer so unsure how high temp Then ST and chest congestion on Sunday   No n/v/d   Cough-green phlegm  Does blow nose/ some runny nose  Very tired   No sob or wheezing  No chest tightness   Today a little better  ST is better during the day/ warm liquid  Also lost voice a bit   Fully vaccinated but not boostered for covid   No loss of smell or taste   Exposure- has 2 kids in day care but have been ok  68 mo old had ear infx 2 wk ago   Otc: taking ibuprofen and theraflu  He is drinking fluids  Not a lot of appetite     Patient Active Problem List   Diagnosis Date Noted  . Viral URI with cough 09/25/2020  . Sore throat 09/25/2020  . Inattention 09/29/2019  . Adjustment disorder with mixed anxiety and depressed mood 05/31/2018  . Chronic left shoulder pain 05/28/2017  . Skin callus 05/19/2016  . Health maintenance examination 08/30/2014  . Tinea versicolor 08/30/2014   Past Medical History:  Diagnosis Date  . Seasonal allergies    Past Surgical History:  Procedure Laterality Date  . NO PAST SURGERIES     Social History   Tobacco Use  . Smoking status: Never Smoker  .  Smokeless tobacco: Never Used  Substance Use Topics  . Alcohol use: Yes    Alcohol/week: 0.0 standard drinks    Comment: Occasional  . Drug use: No   Family History  Problem Relation Age of Onset  . Cancer Mother        breast  . Anxiety disorder Mother   . Stroke Neg Hx   . Hypertension Neg Hx   . Diabetes Neg Hx   . CAD Neg Hx    No Known Allergies Current Outpatient Medications on File Prior to Visit  Medication Sig Dispense Refill  . KETOCONAZOLE, TOPICAL, 1 % SHAM Apply topically as directed to skin weekly then monthly PRN tinea versicolor 200 mL 1  . MAGNESIUM-ZINC PO Take 1 tablet by mouth daily.    . MULTIPLE VITAMIN PO Take 2 each by mouth daily. Gummies    . vitamin C (ASCORBIC ACID) 500 MG tablet Take 500 mg by mouth daily. emergenC     No current facility-administered medications on file prior to visit.   Review of Systems  Constitutional: Positive for chills, diaphoresis, fever and malaise/fatigue.  HENT: Positive for congestion. Negative for ear pain, sinus pain and sore throat.   Eyes: Negative for blurred vision, discharge and redness.  Respiratory: Positive for cough and sputum production. Negative for shortness of breath,  wheezing and stridor.   Cardiovascular: Negative for chest pain, palpitations and leg swelling.  Gastrointestinal: Negative for abdominal pain, diarrhea, nausea and vomiting.  Musculoskeletal: Negative for myalgias.  Skin: Negative for rash.  Neurological: Positive for headaches. Negative for dizziness.    Observations/Objective: Pt sounded well, not distressed Mildly hoarse voice Did not cough during interview No sob or wheeze heard  Nl cognition-good historian Nl mood   Assessment and Plan: Problem List Items Addressed This Visit      Respiratory   Viral URI with cough - Primary    Ordered swabs for covid and flu and strep to drive through  Has had 2 vaccines for covid but not booster  Has had flu shot   Adv fluids and rest   otc symptom tx- adv guaifenesin to help congestion  Ibuprofen for fever prn  inst to call if symptoms worsen-esp if he develops sob  Pending labs         Relevant Medications   KETOCONAZOLE, TOPICAL, 1 % SHAM     Other   Sore throat    With some uri symptoms  He has young children at home Strep swab added to tests ordered Recommend fluids and hot drinks if helpful          Follow Up Instructions: Drink fluids and rest The office will call you to set up a drive through appt for swabs (covid and flu and strep) Isolate yourself until we get results   Over the counter expectorant may help with mucous/phlegm  If your symptoms suddenly worsen or you develop shortness of breath let us know and get to ER if needed    I discussed the assessment and treatment plan with the patient. The patient was provided an opportunity to ask questions and all were answered. The patient agreed with the plan and demonstrated an understanding of the instructions.   The patient was advised to call back or seek an in-person evaluation if the symptoms worsen or if the condition fails to improve as anticipated.  I provided 16 minutes of non-face-to-face time during this encounter.   Roxy Manns, MD

## 2020-09-27 LAB — SARS-COV-2, NAA 2 DAY TAT

## 2020-09-27 LAB — NOVEL CORONAVIRUS, NAA: SARS-CoV-2, NAA: DETECTED — AB

## 2020-09-28 ENCOUNTER — Ambulatory Visit (INDEPENDENT_AMBULATORY_CARE_PROVIDER_SITE_OTHER): Payer: 59 | Admitting: Psychology

## 2020-09-28 DIAGNOSIS — F4321 Adjustment disorder with depressed mood: Secondary | ICD-10-CM | POA: Diagnosis not present

## 2020-10-09 ENCOUNTER — Ambulatory Visit: Payer: 59 | Admitting: Psychology

## 2020-10-10 DIAGNOSIS — F411 Generalized anxiety disorder: Secondary | ICD-10-CM

## 2020-10-10 DIAGNOSIS — F902 Attention-deficit hyperactivity disorder, combined type: Secondary | ICD-10-CM

## 2020-10-10 DIAGNOSIS — F341 Dysthymic disorder: Secondary | ICD-10-CM

## 2020-10-12 ENCOUNTER — Ambulatory Visit: Payer: 59 | Admitting: Psychology

## 2020-10-26 ENCOUNTER — Ambulatory Visit (INDEPENDENT_AMBULATORY_CARE_PROVIDER_SITE_OTHER): Payer: 59 | Admitting: Psychology

## 2020-10-26 DIAGNOSIS — F4321 Adjustment disorder with depressed mood: Secondary | ICD-10-CM | POA: Diagnosis not present

## 2020-11-23 ENCOUNTER — Ambulatory Visit (INDEPENDENT_AMBULATORY_CARE_PROVIDER_SITE_OTHER): Payer: 59 | Admitting: Psychology

## 2020-11-23 DIAGNOSIS — F4321 Adjustment disorder with depressed mood: Secondary | ICD-10-CM

## 2020-11-27 ENCOUNTER — Ambulatory Visit (INDEPENDENT_AMBULATORY_CARE_PROVIDER_SITE_OTHER): Payer: Managed Care, Other (non HMO) | Admitting: Family Medicine

## 2020-11-27 ENCOUNTER — Ambulatory Visit (INDEPENDENT_AMBULATORY_CARE_PROVIDER_SITE_OTHER)
Admission: RE | Admit: 2020-11-27 | Discharge: 2020-11-27 | Disposition: A | Discharge status: Home / Self Care | Payer: Managed Care, Other (non HMO) | Source: Ambulatory Visit | Attending: Family Medicine | Admitting: Family Medicine

## 2020-11-27 ENCOUNTER — Encounter: Payer: Self-pay | Admitting: Family Medicine

## 2020-11-27 ENCOUNTER — Ambulatory Visit
Admission: RE | Admit: 2020-11-27 | Discharge: 2020-11-27 | Disposition: A | Discharge status: Home / Self Care | Payer: Managed Care, Other (non HMO) | Source: Ambulatory Visit | Attending: Family Medicine | Admitting: Family Medicine

## 2020-11-27 ENCOUNTER — Other Ambulatory Visit: Payer: Self-pay

## 2020-11-27 VITALS — BP 112/70 | HR 74 | Temp 98.2°F | Ht 73.0 in | Wt 212.5 lb

## 2020-11-27 DIAGNOSIS — M25532 Pain in left wrist: Secondary | ICD-10-CM

## 2020-11-27 DIAGNOSIS — R41840 Attention and concentration deficit: Secondary | ICD-10-CM

## 2020-11-27 DIAGNOSIS — Z113 Encounter for screening for infections with a predominantly sexual mode of transmission: Secondary | ICD-10-CM

## 2020-11-27 DIAGNOSIS — M25531 Pain in right wrist: Secondary | ICD-10-CM

## 2020-11-27 NOTE — Patient Instructions (Addendum)
I think you had bilateral wrist sprains with ganglion cyst on left and possible lateral epicondylitis.  Bilateral wrist xrays today  Exercises provided today. May get tennis elbow strap to use as well.  Use wrist brace to right wrist consistently for 2 weeks and up to 4 if needed.  May use voltaren over the counter topical anti inflammatory gel to tender areas.  Let us know if not improving with this.   https://www.foothealthfacts.org/conditions/ganglion-cyst"> https://www.clinicalkey.com">  Ganglion Cyst  A ganglion cyst is a non-cancerous, fluid-filled lump of tissue that occurs near a joint, tendon, or ligament. The cyst grows out of a joint or the lining of a tendon or ligament. Ganglion cysts most often develop in the hand or wrist, but they can also develop in the shoulder, elbow, hip, knee, ankle, or foot. Ganglion cysts are ball-shaped or egg-shaped. Their size can range from the size of a pea to larger than a grape. Increased activity may cause the cyst to get bigger because more fluid starts to build up. What are the causes? The exact cause of this condition is not known, but it may be related to:  Inflammation or irritation around the joint.  An injury or tear in the layers of tissue around the joint (joint capsule).  Repetitive movements or overuse.  History of acute or repeated injury. What increases the risk? You are more likely to develop this condition if:  You are a male.  You are 27-22 years old. What are the signs or symptoms? The main symptom of this condition is a lump. It most often appears on the hand or wrist. In many cases, there are no other symptoms, but a cyst can sometimes cause:  Tingling.  Pain or tenderness.  Numbness.  Weakness or loss of strength in the affected joint.  Decreased range of motion in the affected area of the body.   How is this diagnosed? Ganglion cysts are usually diagnosed based on a physical exam. Your health care provider  will feel the lump and may shine a light next to it. If it is a ganglion cyst, the light will likely shine through it. Your health care provider may order an X-ray, ultrasound, MRI, or CT scan to rule out other conditions. How is this treated? Ganglion cysts often go away on their own without treatment. If you have pain or other symptoms, treatment may be needed. Treatment is also needed if the ganglion cyst limits your movement or if it gets infected. Treatment may include:  Wearing a brace or splint on your wrist or finger.  Taking anti-inflammatory medicine.  Having fluid drained from the lump with a needle (aspiration).  Getting an injection of medicine into the joint to decrease inflammation. This may be corticosteroids, ethanol, or hyaluronidase.  Having surgery to remove the ganglion cyst.  Placing a pad in your shoe or wearing shoes that will not rub against the cyst if it is on your foot. Follow these instructions at home:  Do not press on the ganglion cyst, poke it with a needle, or hit it.  Take over-the-counter and prescription medicines only as told by your health care provider.  If you have a brace or splint: ? Wear it as told by your health care provider. ? Remove it as told by your health care provider. Ask if you need to remove it when you take a shower or a bath.  Watch your ganglion cyst for any changes.  Keep all follow-up visits as told by your  health care provider. This is important. Contact a health care provider if:  Your ganglion cyst becomes larger or more painful.  You have pus coming from the lump.  You have weakness or numbness in the affected area.  You have a fever or chills. Get help right away if:  You have a fever and have any of these in the cyst area: ? Increased redness. ? Red streaks. ? Swelling. Summary  A ganglion cyst is a non-cancerous, fluid-filled lump that occurs near a joint, tendon, or ligament.  Ganglion cysts most often  develop in the hand or wrist, but they can also develop in the shoulder, elbow, hip, knee, ankle, or foot.  Ganglion cysts often go away on their own without treatment. This information is not intended to replace advice given to you by your health care provider. Make sure you discuss any questions you have with your health care provider. Document Revised: 11/30/2019 Document Reviewed: 11/30/2019 Elsevier Patient Education  2021 [employer].

## 2020-11-27 NOTE — Progress Notes (Unsigned)
Patient ID: Aaron Pugh, male    DOB: 1986/06/23, 35 y.o.   MRN: [id number]  This visit was conducted in person.  BP 112/70   Pulse 74   Temp 98.2 F (36.8 C) (Temporal)   Ht 6\' 1"  (1.854 m)   Wt 212 lb 8 oz (96.4 kg)   SpO2 98%   BMI 28.04 kg/m    CC: bilat wrist pain, R elbow pain after fall  Subjective:   HPI: Aaron Pugh is a 35 y.o. male presenting on 11/27/2020 for Joint Pain (C/o bilateral wrists and right elbow pain.  01/27/2021 about 1.5 mos ago while snowboarding.  Also, c/o bump on left wrist. )   DOI: 1.5 months ago  [place] Seat while snow boarding - "wipeout" - fell on outstretched hands, hyperextension to R elbow, landed in icy snow. Since fall noted bump to dorsal L wrist - area of pain.  Notes pain to R radial wrist and pain to R lateral elbow.  He's been using wrist compression wrap with benefit. Activity limited by pain - trouble working out, push ups, weight lifting.  He is normally very active.   Treating pain with ibuprofen.  R handed.   By the way - requests STD screen. Has had several sexual partners without consistent use of protection.      Relevant past medical, surgical, family and social history reviewed and updated as indicated. Interim medical history since our last visit reviewed. Allergies and medications reviewed and updated. Outpatient Medications Prior to Visit  Medication Sig Dispense Refill  . KETOCONAZOLE, TOPICAL, 1 % SHAM Apply topically as directed to skin weekly then monthly PRN tinea versicolor 200 mL 0  . MULTIPLE VITAMIN PO Take 2 each by mouth daily. Gummies    . vitamin C (ASCORBIC ACID) 500 MG tablet Take 500 mg by mouth daily. emergenC    . MAGNESIUM-ZINC PO Take 1 tablet by mouth daily.     No facility-administered medications prior to visit.     Per HPI unless specifically indicated in ROS section below Review of Systems Objective:  BP 112/70   Pulse 74   Temp 98.2 F (36.8 C) (Temporal)   Ht 6\' 1"  (1.854 m)   Wt 212 lb 8  oz (96.4 kg)   SpO2 98%   BMI 28.04 kg/m   Wt Readings from Last 3 Encounters:  11/27/20 212 lb 8 oz (96.4 kg)  09/25/20 205 lb (93 kg)  06/06/20 216 lb 6 oz (98.1 kg)      Physical Exam Vitals and nursing note reviewed.  Constitutional:      Appearance: Normal appearance. He is not ill-appearing.  Musculoskeletal:        General: Tenderness present. No swelling or deformity. Normal range of motion.     Comments:  2+ radial pulses bilaterally ROM preserved at bilateral elbows and bilateral wrists  Point tender to R lateral epicondyle as well as R radial wrist along APL/ELB tendons, no pain at scaphoid or base of 1st CMC or at TFCC  Tender to palpation along dorsal L wrist with evident hard bump present  Skin:    General: Skin is warm and dry.     Findings: No rash.     Comments: Sensation intact  Neurological:     Mental Status: He is alert.       DG Wrist Complete Left CLINICAL DATA:  Bilateral wrist pain after fall.  EXAM: LEFT WRIST - COMPLETE 3+ VIEW  COMPARISON:  None.  FINDINGS: There is no evidence of fracture or dislocation. Normal joint spaces and alignment. Carpal bones and scaphoid are intact. There is no evidence of arthropathy or other focal bone abnormality. Soft tissues are unremarkable.  IMPRESSION: Negative radiographs of the left wrist.  Electronically Signed   By: Narda Rutherford M.D.   On: 11/27/2020 23:08 DG Wrist Complete Right CLINICAL DATA:  Bilateral wrist pain after fall.  EXAM: RIGHT WRIST - COMPLETE 3+ VIEW  COMPARISON:  None.  FINDINGS: There is no evidence of fracture or dislocation. Normal joint spaces and alignment. Scaphoid and carpal bones are intact. There is no evidence of arthropathy or other focal bone abnormality. Soft tissues are unremarkable.  IMPRESSION: Negative radiographs of the right wrist.  Electronically Signed   By: Narda Rutherford M.D.   On: 11/27/2020 23:08   Assessment & Plan:  This visit  occurred during the SARS-CoV-2 public health emergency.  Safety protocols were in place, including screening questions prior to the visit, additional usage of staff PPE, and extensive cleaning of exam room while observing appropriate contact time as indicated for disinfecting solutions.   Problem List Items Addressed This Visit    Inattention    Has completed evaluation by Dr Reggy Eye for inattention - we will request records and he will circle back around in a few weeks to review treatment options. May need return office visit.       Bilateral wrist pain - Primary    Anticipate R wrist sprain and L ganglion cyst after fall. Check films r/o scaphoid or other fracture. Supportive care reviewed - recommend consistent wrist brace use for 2-4 weeks, update if ongoing after this.  R elbow - anticipate lateral epicondylitis - discussed tennis elbow strap vs compression sleeve and rest.  Discussed voltaren gel use.       Relevant Orders   DG Wrist Complete Right (Completed)   DG Wrist Complete Left (Completed)    Other Visit Diagnoses    Screen for STD (sexually transmitted disease)       Relevant Orders   HIV Antibody (routine testing w rflx) (Completed)   C. trachomatis/N. gonorrhoeae RNA (Completed)   RPR (Completed)       No orders of the defined types were placed in this encounter.  Orders Placed This Encounter  Procedures  . C. trachomatis/N. gonorrhoeae RNA  . DG Wrist Complete Right    Standing Status:   Future    Number of Occurrences:   1    Standing Expiration Date:   11/27/2021    Order Specific Question:   Reason for Exam (SYMPTOM  OR DIAGNOSIS REQUIRED)    Answer:   wrist pain after fall    Order Specific Question:   Preferred imaging location?    Answer:   [facility]  . DG Wrist Complete Left    Standing Status:   Future    Number of Occurrences:   1    Standing Expiration Date:   11/27/2021    Order Specific Question:   Reason for Exam (SYMPTOM  OR DIAGNOSIS  REQUIRED)    Answer:   wrist pain after fall    Order Specific Question:   Preferred imaging location?    Answer:   [facility]  . HIV Antibody (routine testing w rflx)  . RPR     Patient instructions: I think you had bilateral wrist sprains with ganglion cyst on left and possible lateral epicondylitis.  Bilateral wrist xrays today  Exercises  provided today. May get tennis elbow strap to use as well.  Use wrist brace to right wrist consistently for 2 weeks and up to 4 if needed.  May use voltaren over the counter topical anti inflammatory gel to tender areas.  Let us know if not improving with this.   Follow up plan: No follow-ups on file.  Eustaquio Boyden, MD

## 2020-11-29 ENCOUNTER — Encounter: Payer: Self-pay | Admitting: Family Medicine

## 2020-11-29 DIAGNOSIS — M25531 Pain in right wrist: Secondary | ICD-10-CM | POA: Insufficient documentation

## 2020-11-29 LAB — RPR: RPR Ser Ql: NONREACTIVE

## 2020-11-29 LAB — C. TRACHOMATIS/N. GONORRHOEAE RNA
C. trachomatis RNA, TMA: NOT DETECTED
N. gonorrhoeae RNA, TMA: NOT DETECTED

## 2020-11-29 LAB — HIV ANTIBODY (ROUTINE TESTING W REFLEX): HIV 1&2 Ab, 4th Generation: NONREACTIVE

## 2020-11-29 NOTE — Assessment & Plan Note (Addendum)
Has completed evaluation by Dr Reggy Eye for inattention - we will request records and he will circle back around in a few weeks to review treatment options. May need return office visit.

## 2020-11-29 NOTE — Assessment & Plan Note (Addendum)
Anticipate R wrist sprain and L ganglion cyst after fall. Check films r/o scaphoid or other fracture. Supportive care reviewed - recommend consistent wrist brace use for 2-4 weeks, update if ongoing after this.  R elbow - anticipate lateral epicondylitis - discussed tennis elbow strap vs compression sleeve and rest.  Discussed voltaren gel use.

## 2020-12-21 ENCOUNTER — Ambulatory Visit (INDEPENDENT_AMBULATORY_CARE_PROVIDER_SITE_OTHER): Payer: 59 | Admitting: Psychology

## 2020-12-21 DIAGNOSIS — F4321 Adjustment disorder with depressed mood: Secondary | ICD-10-CM

## 2020-12-26 ENCOUNTER — Encounter: Payer: Self-pay | Admitting: Family Medicine

## 2021-01-04 ENCOUNTER — Ambulatory Visit (INDEPENDENT_AMBULATORY_CARE_PROVIDER_SITE_OTHER): Payer: 59 | Admitting: Psychology

## 2021-01-04 DIAGNOSIS — F4321 Adjustment disorder with depressed mood: Secondary | ICD-10-CM | POA: Diagnosis not present

## 2021-01-18 ENCOUNTER — Ambulatory Visit (INDEPENDENT_AMBULATORY_CARE_PROVIDER_SITE_OTHER): Payer: 59 | Admitting: Psychology

## 2021-01-18 DIAGNOSIS — F4321 Adjustment disorder with depressed mood: Secondary | ICD-10-CM | POA: Diagnosis not present

## 2021-01-31 ENCOUNTER — Other Ambulatory Visit: Payer: Self-pay

## 2021-01-31 ENCOUNTER — Emergency Department (HOSPITAL_BASED_OUTPATIENT_CLINIC_OR_DEPARTMENT_OTHER)
Admission: EM | Admit: 2021-01-31 | Discharge: 2021-01-31 | Disposition: A | Discharge status: Home / Self Care | Payer: Managed Care, Other (non HMO)

## 2021-01-31 NOTE — ED Triage Notes (Signed)
Patient here POV from Home with Hand Laceration.  Laceration occurred to Anterior hand while patient was cooking.  1-2 cm; bleeding controlled; no pain; NAD

## 2021-01-31 NOTE — ED Notes (Signed)
During Triage Process, patient stated they would like to Leave before finishing Triage Process. Patient did not want to complete process, be assessed by the EDP, or have further assessment including VS. Patient will be discharged as LWBS. Patient signed Proper Form and left Ambulatory to home. Patient made aware to return if condition worsens.

## 2021-02-01 ENCOUNTER — Ambulatory Visit (INDEPENDENT_AMBULATORY_CARE_PROVIDER_SITE_OTHER): Payer: 59 | Admitting: Psychology

## 2021-02-01 DIAGNOSIS — F4321 Adjustment disorder with depressed mood: Secondary | ICD-10-CM

## 2021-02-15 ENCOUNTER — Ambulatory Visit (INDEPENDENT_AMBULATORY_CARE_PROVIDER_SITE_OTHER): Payer: 59 | Admitting: Psychology

## 2021-02-15 DIAGNOSIS — F4321 Adjustment disorder with depressed mood: Secondary | ICD-10-CM | POA: Diagnosis not present

## 2021-03-01 ENCOUNTER — Ambulatory Visit: Payer: 59 | Admitting: Psychology

## 2021-03-15 ENCOUNTER — Ambulatory Visit (INDEPENDENT_AMBULATORY_CARE_PROVIDER_SITE_OTHER): Payer: 59 | Admitting: Psychology

## 2021-03-15 DIAGNOSIS — F4321 Adjustment disorder with depressed mood: Secondary | ICD-10-CM | POA: Diagnosis not present

## 2021-03-29 ENCOUNTER — Ambulatory Visit (INDEPENDENT_AMBULATORY_CARE_PROVIDER_SITE_OTHER): Payer: 59 | Admitting: Psychology

## 2021-03-29 DIAGNOSIS — F4321 Adjustment disorder with depressed mood: Secondary | ICD-10-CM

## 2021-04-12 ENCOUNTER — Ambulatory Visit (INDEPENDENT_AMBULATORY_CARE_PROVIDER_SITE_OTHER): Payer: 59 | Admitting: Psychology

## 2021-04-12 DIAGNOSIS — F4321 Adjustment disorder with depressed mood: Secondary | ICD-10-CM | POA: Diagnosis not present

## 2021-04-26 ENCOUNTER — Ambulatory Visit (INDEPENDENT_AMBULATORY_CARE_PROVIDER_SITE_OTHER): Payer: 59 | Admitting: Psychology

## 2021-04-26 DIAGNOSIS — F4321 Adjustment disorder with depressed mood: Secondary | ICD-10-CM

## 2021-05-24 ENCOUNTER — Ambulatory Visit (INDEPENDENT_AMBULATORY_CARE_PROVIDER_SITE_OTHER): Payer: 59 | Admitting: Psychology

## 2021-05-24 DIAGNOSIS — F4320 Adjustment disorder, unspecified: Secondary | ICD-10-CM

## 2021-06-07 ENCOUNTER — Ambulatory Visit (INDEPENDENT_AMBULATORY_CARE_PROVIDER_SITE_OTHER): Payer: 59 | Admitting: Psychology

## 2021-06-07 DIAGNOSIS — F4321 Adjustment disorder with depressed mood: Secondary | ICD-10-CM | POA: Diagnosis not present

## 2021-06-21 ENCOUNTER — Ambulatory Visit (INDEPENDENT_AMBULATORY_CARE_PROVIDER_SITE_OTHER): Payer: 59 | Admitting: Psychology

## 2021-06-21 DIAGNOSIS — F4321 Adjustment disorder with depressed mood: Secondary | ICD-10-CM

## 2021-07-05 ENCOUNTER — Ambulatory Visit: Payer: 59 | Admitting: Psychology

## 2021-07-19 ENCOUNTER — Ambulatory Visit (INDEPENDENT_AMBULATORY_CARE_PROVIDER_SITE_OTHER): Payer: 59 | Admitting: Psychology

## 2021-07-19 DIAGNOSIS — F4321 Adjustment disorder with depressed mood: Secondary | ICD-10-CM | POA: Diagnosis not present

## 2021-08-02 ENCOUNTER — Ambulatory Visit (INDEPENDENT_AMBULATORY_CARE_PROVIDER_SITE_OTHER): Payer: 59 | Admitting: Psychology

## 2021-08-02 DIAGNOSIS — F4321 Adjustment disorder with depressed mood: Secondary | ICD-10-CM

## 2021-08-30 ENCOUNTER — Ambulatory Visit (INDEPENDENT_AMBULATORY_CARE_PROVIDER_SITE_OTHER): Payer: 59 | Admitting: Psychology

## 2021-08-30 DIAGNOSIS — F4321 Adjustment disorder with depressed mood: Secondary | ICD-10-CM | POA: Diagnosis not present

## 2021-08-30 NOTE — Progress Notes (Addendum)
[facility] Counselor/Therapist Progress Note  Patient ID: Aaron Pugh, MRN: [id number],    Date: 08/30/2021  Time Spent: 50 mins  Treatment Type: Individual Therapy  Reported Symptoms: Pt shares not much is different in his life since our last session.  He indicates he feels somewhat stuck and feels like he may be making choices that result in him staying in that state.  Mental Status Exam: Appearance:  Casual     Behavior: Appropriate and Sharing  Motor: Normal  Speech/Language:  Clear and Coherent  Affect: Congruent  Mood: normal  Thought process: normal  Thought content:   WNL  Sensory/Perceptual disturbances:   WNL  Orientation: oriented to person, place, and time/date  Attention: Good  Concentration: Good  Memory: WNL  Fund of knowledge:  Good  Insight:   Good  Judgment:  Good  Impulse Control: Good   Risk Assessment: Danger to Self:  No Self-injurious Behavior: No Danger to Others: No Duty to Warn:no Physical Aggression / Violence:No  Access to Firearms a concern: No  Gang Involvement:No   Subjective: Pt presented for follow up session, via Webex video, due to virus outbreak.  Pt granted consent for session, stating that he is dog sitting for [facility] with no one else present.  I shared with pt that I am in my office at home with no one else here either.  Pt shares that he went to Rachel's home for Thanksgiving, and it was fine with her and the boys.  Her parents and her brother were also there; he was a little uncomfortable with how to act around them.  Pt shares he is spending a lot of time with Bjorn Loser, and she has told him that she is in love with him.  He told her he would be monogamous with her, and he knew that was a lie and it has been a lie.  Pt acknowledges that he does receive validation when women call him back to be with him again after their first encounter.  Pt shares that the boys are doing well; he enjoys the time he is able to spend with  them.  He is trying to be as present as he can be, especially for Isaac's activities.  Pt shares that work is going well; "I am swamped with work because I am still having trouble focusing."  Pt shares he has gained 20 pounds this year.  He continues to work out regularly and is stronger than he used to be, but he does not like the way he looks right now.  Pt shares that he and [name] have been talking about the separation agreement.  They have agreed to most everything but pt has not signed it yet.  He is coming to understand that he will not be as well off after the divorce as [name] will be.  Encouraged pt to continue with his self care activities and we will meet in 4 wks for a follow up session.   Interventions: Cognitive Behavioral Therapy  Diagnosis:Adjustment disorder with depressed mood  Plan: Patient is to use CBT, mindfulness and coping skills to help manage decrease symptoms associated with their diagnosis.   Long-term goal:   Reduce overall level, frequency, and intensity of the feelings of depression evidenced by decreased irritability, negative self talk, and helpless feelings from 6 to 7 days/week to 0 to 1 days/week per client report for at least 3 consecutive months.   Short-term goal:  Verbally express understanding of the relationship between feelings  of depression and their impact on thinking patterns and behaviors. Verbalize an understanding of the role that distorted thinking plays in creating fears, excessive worry, and ruminations.  Target date: 08/30/2022  Karie Kirks, [facility]

## 2021-09-27 ENCOUNTER — Ambulatory Visit (INDEPENDENT_AMBULATORY_CARE_PROVIDER_SITE_OTHER): Payer: 59 | Admitting: Psychology

## 2021-09-27 DIAGNOSIS — F4321 Adjustment disorder with depressed mood: Secondary | ICD-10-CM | POA: Diagnosis not present

## 2021-09-27 NOTE — Progress Notes (Signed)
[facility] Counselor/Therapist Progress Note  Patient ID: Aaron Pugh, MRN: [id number],    Date: 09/27/2021  Time Spent: 50 mins  Treatment Type: Individual Therapy  Reported Symptoms: Pt presents for today's session, via webex video, due to virus outbreak.  Pt grants consent for today's session, stating he is in his apt with no one else present.  I shared with pt that I am in my office at home with no one else present here.  He spent Christmas Eve and much of Christmas Day with [name] and the boys at [place] home.  "It was bittersweet knowing that my Christmases will never be the same."    Mental Status Exam: Appearance:  Casual     Behavior: Appropriate and Sharing  Motor: Normal  Speech/Language:  Clear and Coherent  Affect: Congruent  Mood: normal  Thought process: normal  Thought content:   WNL  Sensory/Perceptual disturbances:   WNL  Orientation: oriented to person, place, and time/date  Attention: Good  Concentration: Good  Memory: WNL  Fund of knowledge:  Good  Insight:   Good  Judgment:  Good  Impulse Control: Good   Risk Assessment: Danger to Self:  No Self-injurious Behavior: No Danger to Others: No Duty to Warn:no Physical Aggression / Violence:No  Access to Firearms a concern: No  Gang Involvement:No   Subjective: Pt shares that Rachel's parents and brother were back for Christmas and pt just realized that "Christmases will not be the same for me for several years."  Pt shares that "I am not sure what is going on with me and Bjorn Loser; we were supposed to go away together this weekend, but that is now cancelled because of a conversation we had last night."  Pt shares he told Bjorn Loser that he still was not having feeling for her and she did not like that.  Pt shares that he "was somewhat intentional about pushing her away."  Pt describes times when he is with Bjorn Loser and being intimate to her and he will catch himself knowing that acting this way is a  mistake for him and for her.  Pt shares that he normally struggles with his mental health during the holidays and in the winter.  Pt shares that [name] has been pushing him to sign the Separation Agreement but pt "has been dragging my feet."  Pt shares that [name] makes twice as much money as pt does.  The sticking point at this time is whether they split Rachel's HSA or pt gives up his half of it and then [name] pays for all of the health care expenses for the kids.  Pt shares, "I don't just want to roll over and give [name] everything she wants."  Pt shares that he still asks [name] if she wants to do things together with the boys.  We will meet in 4 wks for a follow up session.  Interventions: Cognitive Behavioral Therapy  Diagnosis:Adjustment disorder with depressed mood  Plan: Treatment Plan Strengths/Abilities:  Intelligent, Intuitive, Willing to participate in therapy Treatment Preferences:  Outpatient Individual Therapy Statement of Needs:  Patient is to use CBT, mindfulness and coping skills to help manage and/or decrease symptoms associated with their diagnosis. Symptoms:  Depressed/Irritable mood, worry, social withdrawal Problems Addressed:  Depressive thoughts, Sadness, Sleep issues, etc. Long Term Goals:  Pt to reduce overall level, frequency, and intensity of the feelings of depression as evidenced by decreased irritability, negative self talk, and helpless feelings from 6 to 7 days/week to 0 to  1 days/week, per client report, for at least 3 consecutive months.  Progress: 30% Short Term Goals:  Pt to verbally express understanding of the relationship between feelings of depression and their impact on thinking patterns and behaviors.  Pt to verbalize an understanding of the role that distorted thinking plays in creating fears, excessive worry, and ruminations.  Progress: 30% Target Date:  09/27/2022 Frequency:  Monthly Modality:  Cognitive Behavioral Therapy Interventions by Therapist:   Therapist will use CBT, Mindfulness exercises, Coping skills and Referrals, as needed by client. Client has verbally approved this treatment plan. Target date: 08/30/2022  Karie Kirks, [facility]

## 2021-10-25 ENCOUNTER — Ambulatory Visit (INDEPENDENT_AMBULATORY_CARE_PROVIDER_SITE_OTHER): Payer: 59 | Admitting: Psychology

## 2021-10-25 DIAGNOSIS — F4321 Adjustment disorder with depressed mood: Secondary | ICD-10-CM | POA: Diagnosis not present

## 2021-10-25 NOTE — Progress Notes (Signed)
[facility] Counselor/Therapist Progress Note  Patient ID: Aaron Pugh, MRN: [id number],    Date: 10/25/2021  Time Spent: 50 mins  Treatment Type: Individual Therapy  Reported Symptoms: Pt presents for today's session, via webex video, due to virus outbreak.  Pt grants consent for today's session, stating he is in his apt with no one else present.  I shared with pt that I am in my office at home with no one else present here.  Pt shares that he and [name] "finally signed the Separation Agreement on Wednesday in the attorney's office."  Pt shares that he thinks the agreement is "pretty fair for me."  Pt did not have an attorney review the documents for him "because I feel guilty about the way I left and feel like I don't really believe I deserve anything that I am getting anyway."   Mental Status Exam: Appearance:  Casual     Behavior: Appropriate and Sharing  Motor: Normal  Speech/Language:  Clear and Coherent  Affect: Congruent  Mood: normal  Thought process: normal  Thought content:   WNL  Sensory/Perceptual disturbances:   WNL  Orientation: oriented to person, place, and time/date  Attention: Good  Concentration: Good  Memory: WNL  Fund of knowledge:  Good  Insight:   Good  Judgment:  Good  Impulse Control: Good   Risk Assessment: Danger to Self:  No Self-injurious Behavior: No Danger to Others: No Duty to Warn:no Physical Aggression / Violence:No  Access to Firearms a concern: No  Gang Involvement:No   Subjective: Pt shares that [name] has agreed to fund $1000.00 for him for a vasectomy and has agreed to fund $1000.00 for pt to get lasik procedure this calendar year.  Pt shares that [name] "has been acting very differently towards me.  She has been acting more about she is OK with [place] ending our relationship."  Pt shares he continues to spend time with Bjorn Loser "although I still have no feelings for her."  Pt shares that he and Bjorn Loser both have their kids the  same weekends and the opposite weekends they also spend the whole weekend together.  He understands that spending this time with Bjorn Loser may lead her to have more expectations than he can/is willing to reciprocate.  Pt shares that he has been performing better at work lately and he feels good about that.  He shares that last Thursday he worked from 8am until 7am Friday without leaving the office.  Pt has decided to sign a new 13 month lease at his apt and he is hoping to be able to buy a house near the end of his apt lease.  Pt feels better about his financial situation now that he and [name] have signed the agreement.  He is planning to pay his car off when he gets the money from his share of the home.  Pt shares that "I am more focused on being a dad these days."  Encouraged pt to continue to engage with his self care activities and we will meet again in 4 wks for a follow up session.    Interventions: Cognitive Behavioral Therapy  Diagnosis:Adjustment disorder with depressed mood  Plan: Treatment Plan Strengths/Abilities:  Intelligent, Intuitive, Willing to participate in therapy Treatment Preferences:  Outpatient Individual Therapy Statement of Needs:  Patient is to use CBT, mindfulness and coping skills to help manage and/or decrease symptoms associated with their diagnosis. Symptoms:  Depressed/Irritable mood, worry, social withdrawal Problems Addressed:  Depressive thoughts, Sadness, Sleep  issues, etc. Long Term Goals:  Pt to reduce overall level, frequency, and intensity of the feelings of depression as evidenced by decreased irritability, negative self talk, and helpless feelings from 6 to 7 days/week to 0 to 1 days/week, per client report, for at least 3 consecutive months.  Progress: 30% Short Term Goals:  Pt to verbally express understanding of the relationship between feelings of depression and their impact on thinking patterns and behaviors.  Pt to verbalize an understanding of the role  that distorted thinking plays in creating fears, excessive worry, and ruminations.  Progress: 30% Target Date:  09/27/2022 Frequency:  Monthly Modality:  Cognitive Behavioral Therapy Interventions by Therapist:  Therapist will use CBT, Mindfulness exercises, Coping skills and Referrals, as needed by client. Client has verbally approved this treatment plan. Target date: 08/30/2022  Karie Kirks, [facility]                  Karie Kirks, [facility]

## 2021-11-22 ENCOUNTER — Ambulatory Visit (INDEPENDENT_AMBULATORY_CARE_PROVIDER_SITE_OTHER): Payer: 59 | Admitting: Psychology

## 2021-11-22 DIAGNOSIS — F4321 Adjustment disorder with depressed mood: Secondary | ICD-10-CM

## 2021-11-22 NOTE — Progress Notes (Signed)
[place] Counselor/Therapist Progress Note ? ?Patient ID: Aaron Pugh, MRN: [id number],   ? ?Date: 11/22/2021 ? ?Time Spent: 50 mins ? ?Treatment Type: Individual Therapy ? ?Reported Symptoms: Pt presents for today's session, via webex video, due to virus outbreak.  Pt grants consent for today's session, stating he is on a walk with no one else present.  I shared with pt that I am in my office at home with no one else present here.  Pt shares that he "has finally ended the relationship with Suanne Marker this past Suanne Marker.  I had the boys last weekend and I did not reach out to her at all.  We talked again on Monday and I just ended it.  I know she is hurt but I don't really even care about that."  ? ?Mental Status Exam: ?Appearance:  Casual     ?Behavior: Appropriate and Sharing  ?Motor: Normal  ?Speech/Language:  Clear and Coherent  ?Affect: Congruent  ?Mood: normal  ?Thought process: normal  ?Thought content:   WNL  ?Sensory/Perceptual disturbances:   WNL  ?Orientation: oriented to person, place, and time/date  ?Attention: Good  ?Concentration: Good  ?Memory: WNL  ?Fund of knowledge:  Good  ?Insight:   Good  ?Judgment:  Good  ?Impulse Control: Good  ? ?Risk Assessment: ?Danger to Self:  No ?Self-injurious Behavior: No ?Danger to Others: No ?Duty to Warn:no ?Physical Aggression / Violence:No  ?Access to Firearms a concern: No  ?Gang Involvement:No  ? ?Subjective: Pt shares that Rhonda blocked him on all social media and that is what bothers him.  She sent him an email yesterday and told him that their relationship was not good for either one of them.  Pt shares he is not comfortable with her perspective because he believes he was honest with her from the beginning "and she got feelings."  The email accused pt of emotionally abusing her in their relationship but he does not want to believe it.  Pt has re-entered the dating arena, having had dates on M, W, and tonight.  He continues to believe that he is  being "completely honest with the women I sleep with about just wanting sex with them."  I asked pt if he believes that these women are able to feel he same way he does.  The divorce is proceeding and their court date is coming up.  Pt again shares that he does not like being alone, "but I do like my freedom."  "I still like doing things with Apolonio Schneiders and the boys but I do not want to be controlled by her."  Pt also mentions a woman, Eustaquio Maize, has recently told him that she can see that he cares about her when they are having sex; he acknowledges that he does intentionally "adores these women when I am with them because I want to be adored as well."  Pt shares he "knows exactly what she is talking about with this because I do treat them well because I want them to treat me well too."  Encouraged pt to continue to engage with his self care activities and we will meet again in 6 wks for a follow up session.   ? ?Interventions: Cognitive Behavioral Therapy ? ?Diagnosis:Adjustment disorder with depressed mood ? ?Plan: Treatment Plan ?Strengths/Abilities:  Intelligent, Intuitive, Willing to participate in therapy ?Treatment Preferences:  Outpatient Individual Therapy ?Statement of Needs:  Patient is to use CBT, mindfulness and coping skills to help manage and/or decrease symptoms associated with their  diagnosis. ?Symptoms:  Depressed/Irritable mood, worry, social withdrawal ?Problems Addressed:  Depressive thoughts, Sadness, Sleep issues, etc. ?Long Term Goals:  Pt to reduce overall level, frequency, and intensity of the feelings of depression as evidenced by decreased irritability, negative self talk, and helpless feelings from 6 to 7 days/week to 0 to 1 days/week, per client report, for at least 3 consecutive months.  Progress: 30% ?Short Term Goals:  Pt to verbally express understanding of the relationship between feelings of depression and their impact on thinking patterns and behaviors.  Pt to verbalize an understanding  of the role that distorted thinking plays in creating fears, excessive worry, and ruminations.  Progress: 30% ?Target Date:  09/27/2022 ?Frequency:  Monthly ?Modality:  Cognitive Behavioral Therapy ?Interventions by Therapist:  Therapist will use CBT, Mindfulness exercises, Coping skills and Referrals, as needed by client. ?Client has verbally approved this treatment plan. ?Target date: 08/30/2022 ? ?Ivan Anchors, [facility] ?

## 2021-12-20 ENCOUNTER — Ambulatory Visit: Payer: 59 | Admitting: Psychology

## 2022-01-03 ENCOUNTER — Ambulatory Visit (INDEPENDENT_AMBULATORY_CARE_PROVIDER_SITE_OTHER): Payer: 59 | Admitting: Psychology

## 2022-01-03 DIAGNOSIS — F4321 Adjustment disorder with depressed mood: Secondary | ICD-10-CM

## 2022-01-03 NOTE — Progress Notes (Signed)
[facility] Counselor/Therapist Progress Note ? ?Patient ID: Aaron Pugh, MRN: [id number],   ? ?Date: 01/03/2022 ? ?Time Spent: 45 mins ? ?Treatment Type: Individual Therapy ? ?Reported Symptoms: Pt presents for today's session, via webex video, due to virus outbreak.  Pt grants consent for today's session, stating he is in his apt with no one else present.  I shared with pt that I am in my office at home with no one else present here.  Pt shares that he "has been good but not great since our last session."  ? ?Mental Status Exam: ?Appearance:  Casual     ?Behavior: Appropriate and Sharing  ?Motor: Normal  ?Speech/Language:  Clear and Coherent  ?Affect: Congruent  ?Mood: normal  ?Thought process: normal  ?Thought content:   WNL  ?Sensory/Perceptual disturbances:   WNL  ?Orientation: oriented to person, place, and time/date  ?Attention: Good  ?Concentration: Good  ?Memory: WNL  ?Fund of knowledge:  Good  ?Insight:   Good  ?Judgment:  Good  ?Impulse Control: Good  ? ?Risk Assessment: ?Danger to Self:  No ?Self-injurious Behavior: No ?Danger to Others: No ?Duty to Warn:no ?Physical Aggression / Violence:No  ?Access to Firearms a concern: No  ?Gang Involvement:No  ? ?Subjective: Pt shares that in the past year, he does not really see himself as being any different since he broke up with Sherron Monday exactly a year ago.  "I feel like I have lost community in the past year.  I feel like I have been a good father during that year but I have not done anything else."  Pt shares that he has had three conversations with people in the past month that have caused pt to wonder about what other people really think about him and him as a dad.  Pt is considering changing gyms "and change my community."  Talked through different reasons for him considering this potential change.  Pt shares he recently had a great annual review and got a 6% raise and a promotion.  Pt shares that Bjorn Loser continues to reach out to him and seeks  him out again.  He continues to see other women regularly.  The divorce will be final on 4/17; he moved out 2 yrs ago.  Encouraged pt to continue to engage with his self care activities and we will meet again in 4 wks for a follow up session.   ? ?Interventions: Cognitive Behavioral Therapy ? ?Diagnosis:Adjustment disorder with depressed mood ? ?Plan: Treatment Plan ?Strengths/Abilities:  Intelligent, Intuitive, Willing to participate in therapy ?Treatment Preferences:  Outpatient Individual Therapy ?Statement of Needs:  Patient is to use CBT, mindfulness and coping skills to help manage and/or decrease symptoms associated with their diagnosis. ?Symptoms:  Depressed/Irritable mood, worry, social withdrawal ?Problems Addressed:  Depressive thoughts, Sadness, Sleep issues, etc. ?Long Term Goals:  Pt to reduce overall level, frequency, and intensity of the feelings of depression as evidenced by decreased irritability, negative self talk, and helpless feelings from 6 to 7 days/week to 0 to 1 days/week, per client report, for at least 3 consecutive months.  Progress: 30% ?Short Term Goals:  Pt to verbally express understanding of the relationship between feelings of depression and their impact on thinking patterns and behaviors.  Pt to verbalize an understanding of the role that distorted thinking plays in creating fears, excessive worry, and ruminations.  Progress: 30% ?Target Date:  09/27/2022 ?Frequency:  Monthly ?Modality:  Cognitive Behavioral Therapy ?Interventions by Therapist:  Therapist will use CBT, Mindfulness exercises,  Coping skills and Referrals, as needed by client. ?Client has verbally approved this treatment plan. ?Target date: 08/30/2022 ? ?Ivan Anchors, [facility] ?

## 2022-01-17 ENCOUNTER — Ambulatory Visit: Payer: 59 | Admitting: Psychology

## 2022-01-31 ENCOUNTER — Ambulatory Visit: Payer: 59 | Admitting: Psychology

## 2022-02-28 ENCOUNTER — Ambulatory Visit: Payer: 59 | Admitting: Psychology

## 2022-03-28 ENCOUNTER — Ambulatory Visit: Payer: 59 | Admitting: Psychology

## 2022-04-25 ENCOUNTER — Ambulatory Visit (INDEPENDENT_AMBULATORY_CARE_PROVIDER_SITE_OTHER): Payer: 59 | Admitting: Psychology

## 2022-04-25 DIAGNOSIS — F4321 Adjustment disorder with depressed mood: Secondary | ICD-10-CM | POA: Diagnosis not present

## 2022-04-25 NOTE — Progress Notes (Signed)
[facility] Counselor/Therapist Progress Note  Patient ID: Ivy Meriwether, MRN: [id number],    Date: 04/25/2022  Time Spent: 45 mins  Treatment Type: Individual Therapy  Reported Symptoms: Pt presents for today's session, via webex video, due to virus outbreak.  Pt grants consent for today's session, stating he is in his apt with no one else present.  I shared with pt that I am in my office at home with no one else present here.  Pt shares that he "has been good but not great since our last session."   Mental Status Exam: Appearance:  Casual     Behavior: Appropriate and Sharing  Motor: Normal  Speech/Language:  Clear and Coherent  Affect: Congruent  Mood: normal  Thought process: normal  Thought content:   WNL  Sensory/Perceptual disturbances:   WNL  Orientation: oriented to person, place, and time/date  Attention: Good  Concentration: Good  Memory: WNL  Fund of knowledge:  Good  Insight:   Good  Judgment:  Good  Impulse Control: Good   Risk Assessment: Danger to Self:  No Self-injurious Behavior: No Danger to Others: No Duty to Warn:no Physical Aggression / Violence:No  Access to Firearms a concern: No  Gang Involvement:No   Subjective: Pt shares that he has been fine since our last session 01/03/22.  "I have been living my typical life.  My relationship with Bjorn Loser ended badly as you might have expected."  Pt shares that he is in [facility] right now; he has been seeing a woman Marchelle Folks) who lives there; "it is really just a sexual thing."  Pt shares, "I believe in karma and I think I am going to pay for all of this at some point.  I am still leaving a wake of disaster behind me."  Pt shares that he has been thinking more about the need for him to be more intentional in his actions than he has been in the past couple of years.  Pt shares that he believes [name], "is finally over all of my stuff."  He is going to spend the end of next week at the beach with [name]  and the boys.  [name] is now 36 yo, just had his birthday party at [employer] home.  [name] will be 36 yo in October and will be in the 1st grade in August.  Pt shares that he left his old gym and has been trying out other gyms, trying to find a new one.  Encouraged pt to continue to engage with his self care activities and we will meet again in 4 wks for a follow up session.    Interventions: Cognitive Behavioral Therapy  Diagnosis:Adjustment disorder with depressed mood  Plan: Treatment Plan Strengths/Abilities:  Intelligent, Intuitive, Willing to participate in therapy Treatment Preferences:  Outpatient Individual Therapy Statement of Needs:  Patient is to use CBT, mindfulness and coping skills to help manage and/or decrease symptoms associated with their diagnosis. Symptoms:  Depressed/Irritable mood, worry, social withdrawal Problems Addressed:  Depressive thoughts, Sadness, Sleep issues, etc. Long Term Goals:  Pt to reduce overall level, frequency, and intensity of the feelings of depression as evidenced by decreased irritability, negative self talk, and helpless feelings from 6 to 7 days/week to 0 to 1 days/week, per client report, for at least 3 consecutive months.  Progress: 30% Short Term Goals:  Pt to verbally express understanding of the relationship between feelings of depression and their impact on thinking patterns and behaviors.  Pt to verbalize an  understanding of the role that distorted thinking plays in creating fears, excessive worry, and ruminations.  Progress: 30% Target Date:  09/27/2022 Frequency:  Monthly Modality:  Cognitive Behavioral Therapy Interventions by Therapist:  Therapist will use CBT, Mindfulness exercises, Coping skills and Referrals, as needed by client. Client has verbally approved this treatment plan. Target date: 08/30/2022  Karie Kirks, [facility]

## 2022-05-23 ENCOUNTER — Ambulatory Visit: Payer: 59 | Admitting: Psychology

## 2022-06-20 ENCOUNTER — Ambulatory Visit: Payer: 59 | Admitting: Psychology

## 2022-08-01 ENCOUNTER — Ambulatory Visit (INDEPENDENT_AMBULATORY_CARE_PROVIDER_SITE_OTHER): Payer: 59 | Admitting: Psychology

## 2022-08-01 DIAGNOSIS — F4321 Adjustment disorder with depressed mood: Secondary | ICD-10-CM

## 2022-08-01 NOTE — Progress Notes (Signed)
[facility] Counselor/Therapist Progress Note  Patient ID: Aaron Pugh, MRN: [id number],    Date: 08/01/2022  Time Spent: 45 mins  Treatment Type: Individual Therapy  Reported Symptoms: Pt presents for today's session, via webex video, due to virus outbreak.  Pt grants consent for today's session, stating he is in his apt with no one else present.  I shared with pt that I am in my office at home with no one else present here.    Mental Status Exam: Appearance:  Casual     Behavior: Appropriate and Sharing  Motor: Normal  Speech/Language:  Clear and Coherent  Affect: Congruent  Mood: normal  Thought process: normal  Thought content:   WNL  Sensory/Perceptual disturbances:   WNL  Orientation: oriented to person, place, and time/date  Attention: Good  Concentration: Good  Memory: WNL  Fund of knowledge:  Good  Insight:   Good  Judgment:  Good  Impulse Control: Good   Risk Assessment: Danger to Self:  No Self-injurious Behavior: No Danger to Others: No Duty to Warn:no Physical Aggression / Violence:No  Access to Firearms a concern: No  Gang Involvement:No   Subjective: Pt shares that he has been fine since our last session 04/25/22.  "[name] turned 36 yo and [name] turned 36 yo since our last session.  [name] and I took a trip [employer] nutrition[employer]) with the boys as well.  I have reconnected with Bjorn Loser but we are very toxic, so we tend to off and on."  Pt shares that he continues to hook up with other women from time to time although he is seeking fewer relationships.  "I continue to enjoy my freedom."  Pt shares that he has returned to the [employer]; [name] is going there as well.  Pt shares that [name] has told him that she feels embarrassed to be seen by her friends with pt; that does not feel good to pt.  Pt shares that he felt that [name] was more protective of the boys when they were together than pt tends to be.  Since pt is back at the [employer], he and [name]  are ending up in some of the same social events, like a birthday party this weekend.  [name] is in the first grade and is doing great in school.  Pt continues to help [name] around the house and he likes doing that.  He does not have the boys this weekend.  Pt shares that "divorce is starting to suck in ways" that cause people to look at me poorly.  He shares that he reconnected with Bjorn Loser because it gives him connection with someone without having to work at anything.  Pt thinks that he is a pretty good dad but he does not get validation for that part of his life and that is hurtful for pt.  Pt shares again that his parents "are shit" and Rachel's parents are really good; she had better patterns to follow from her parents than pt did.  Pt shares he has not talked to his mom since COVID started; he has not talked with his grandparents either.  He recently learned that his grandmother follows [name] on Instagram.  Pt shares that work is going fine; he is happy to be there; he has been there 8 yrs; "work is comfortable and safe."  Pt shares that money is not an issue for him but he is still not ready to buy a house.   Encouraged pt to continue to engage  with his self care activities and we will meet again in 8 wks for a follow up session.    Interventions: Cognitive Behavioral Therapy  Diagnosis:Adjustment disorder with depressed mood  Plan: Treatment Plan Strengths/Abilities:  Intelligent, Intuitive, Willing to participate in therapy Treatment Preferences:  Outpatient Individual Therapy Statement of Needs:  Patient is to use CBT, mindfulness and coping skills to help manage and/or decrease symptoms associated with their diagnosis. Symptoms:  Depressed/Irritable mood, worry, social withdrawal Problems Addressed:  Depressive thoughts, Sadness, Sleep issues, etc. Long Term Goals:  Pt to reduce overall level, frequency, and intensity of the feelings of depression as evidenced by decreased irritability,  negative self talk, and helpless feelings from 6 to 7 days/week to 0 to 1 days/week, per client report, for at least 3 consecutive months.  Progress: 30% Short Term Goals:  Pt to verbally express understanding of the relationship between feelings of depression and their impact on thinking patterns and behaviors.  Pt to verbalize an understanding of the role that distorted thinking plays in creating fears, excessive worry, and ruminations.  Progress: 30% Target Date:  09/27/2022 Frequency:  Monthly Modality:  Cognitive Behavioral Therapy Interventions by Therapist:  Therapist will use CBT, Mindfulness exercises, Coping skills and Referrals, as needed by client. Client has verbally approved this treatment plan. Target date: 08/30/2022  Karie Kirks, [facility]

## 2022-09-26 ENCOUNTER — Ambulatory Visit (INDEPENDENT_AMBULATORY_CARE_PROVIDER_SITE_OTHER): Payer: 59 | Admitting: Psychology

## 2022-09-26 DIAGNOSIS — F4321 Adjustment disorder with depressed mood: Secondary | ICD-10-CM

## 2022-09-26 NOTE — Progress Notes (Signed)
[facility] Counselor/Therapist Progress Note  Patient ID: Aaron Pugh, MRN: [id number],    Date: 09/26/2022  Time Spent: 60 mins  Treatment Type: Individual Therapy  Reported Symptoms: Pt presents for today's session, via webex video.  Pt grants consent for today's session, stating he is in his apt with no one else present.  I shared with pt that I am in my office at home with no one else present here.    Mental Status Exam: Appearance:  Casual     Behavior: Appropriate and Sharing  Motor: Normal  Speech/Language:  Clear and Coherent  Affect: Congruent  Mood: normal  Thought process: normal  Thought content:   WNL  Sensory/Perceptual disturbances:   WNL  Orientation: oriented to person, place, and time/date  Attention: Good  Concentration: Good  Memory: WNL  Fund of knowledge:  Good  Insight:   Good  Judgment:  Good  Impulse Control: Good   Risk Assessment: Danger to Self:  No Self-injurious Behavior: No Danger to Others: No Duty to Warn:no Physical Aggression / Violence:No  Access to Firearms a concern: No  Gang Involvement:No   Subjective: Pt shares that he has been "relatively good since our last session (early Nov).  I thought I might feel differently about my life but I feel OK.  I am afraid that later in January and February will difficult because they normally are.  I am grateful that I have a good relationship with Aaron Pugh and her family so that we can spend the holidays together for the boys and it is not really all that weird."  Pt shares that Aaron Pugh acts differently around him when they are alone versus when there are other people around and he understands that.  He and Aaron Pugh "are not really on speaking terms right now.  I did not want to be a step dad for her kids and I did not want anything that she had to offer me."  Pt helped Aaron Pugh move since our last session and he learned more about her as a person, especially her finances, during that process  and he was not happy about what he learned.  That led to them not seeing each other as much.  He also shares that during the time he was with Aaron Pugh, he went out with a guy friend, hooked up with another woman and later went to Aaron Pugh's.  She was not happy that he had been with another woman and then came home to her.  He then started to back away from her at that time.  "I just got to the point where I was not putting any effort into it at all."  Pt is back on the dating app [employer].  Pt shares he has been spending time thinking about what type of person he wants for his future partner; he had not been thinking that way in the past.  Pt shares that he continues to go to the [employer].  Pt shares that he has recently been mentioned in posts on social media from women that he has been with during the period since he left [place]; some are OK and others were not so good.  Pt continues to feel that he is a good dad to Aaron Pugh.  He continues to help Aaron Pugh by fixing things around the home and feel like that is good for the boys to see.  Pt shares that work is still going fine; "nothing to report there."  Encouraged pt to  continue to engage with his self care activities and we will meet again in 8 wks for a follow up session.    Interventions: Cognitive Behavioral Therapy  Diagnosis:Adjustment disorder with depressed mood  Plan: Treatment Plan Strengths/Abilities:  Intelligent, Intuitive, Willing to participate in therapy Treatment Preferences:  Outpatient Individual Therapy Statement of Needs:  Patient is to use CBT, mindfulness and coping skills to help manage and/or decrease symptoms associated with their diagnosis. Symptoms:  Depressed/Irritable mood, worry, social withdrawal Problems Addressed:  Depressive thoughts, Sadness, Sleep issues, etc. Long Term Goals:  Pt to reduce overall level, frequency, and intensity of the feelings of depression as evidenced by decreased irritability, negative self  talk, and helpless feelings from 6 to 7 days/week to 0 to 1 days/week, per client report, for at least 3 consecutive months.  Progress: 30% Short Term Goals:  Pt to verbally express understanding of the relationship between feelings of depression and their impact on thinking patterns and behaviors.  Pt to verbalize an understanding of the role that distorted thinking plays in creating fears, excessive worry, and ruminations.  Progress: 30% Target Date:  09/28/2023 Frequency:  Monthly Modality:  Cognitive Behavioral Therapy Interventions by Therapist:  Therapist will use CBT, Mindfulness exercises, Coping skills and Referrals, as needed by client. Client has verbally approved this treatment plan.  Ivan Anchors, [facility]

## 2022-11-21 ENCOUNTER — Ambulatory Visit (INDEPENDENT_AMBULATORY_CARE_PROVIDER_SITE_OTHER): Payer: 59 | Admitting: Psychology

## 2022-11-21 DIAGNOSIS — F4321 Adjustment disorder with depressed mood: Secondary | ICD-10-CM

## 2022-11-21 NOTE — Progress Notes (Signed)
[facility] Counselor/Therapist Progress Note  Patient ID: Aaron Pugh, MRN: [id number],    Date: 11/21/2022  Time Spent: 60 mins  Treatment Type: Individual Therapy  Reported Symptoms: Pt presents for today's session, via webex video.  Pt grants consent for today's session, stating he is in his apt with no one else present.  I shared with pt that I am in my office at home with no one else present here.    Mental Status Exam: Appearance:  Casual     Behavior: Appropriate and Sharing  Motor: Normal  Speech/Language:  Clear and Coherent  Affect: Congruent  Mood: normal  Thought process: normal  Thought content:   WNL  Sensory/Perceptual disturbances:   WNL  Orientation: oriented to person, place, and time/date  Attention: Good  Concentration: Good  Memory: WNL  Fund of knowledge:  Good  Insight:   Good  Judgment:  Good  Impulse Control: Good   Risk Assessment: Danger to Self:  No Self-injurious Behavior: No Danger to Others: No Duty to Warn:no Physical Aggression / Violence:No  Access to Firearms a concern: No  Gang Involvement:No   Subjective: Pt shares that he has been "weird lately.  Not bad necessarily, but not great.  A lot of things are bothering me right now.  I am bothered by not seeing the kids everyday; the divorced life is upsetting me now.  Apolonio Schneiders is taking the boys to see her parents tomorrow so I will not be able to see them this weekend.  I then wonder how am I going to spend my time this weekend."  Pt shares that he sometimes feels "irrationally down over the past few weeks."  We have talked about pt's belief that he is negatively effected by Seasonal Affective Disorder.  Pt shares that he knows that he scrolls on his phone too much.  He shares that he has spent a lot of money in Feb for his car Photographer, tires, Dentist, Social research officer, government.).  Pt shares that he and Apolonio Schneiders are going to take the boys to [place] in June.  He had wanted to go to in January but  Apolonio Schneiders chose June.  He wanted to go for 5 days, she wanted to go for 7 days, so they are going for 7 days.  He is often frustrated that Apolonio Schneiders describes herself as the primary parent and "I feel like I am just there, just like I felt in our marriage."  Pt continues to go to the gym frequently and enjoys that activity.  Pt shares that he and Suanne Marker are still not seeing each other.  Pt shares that he is aware that "I have pushed away anyone in my life who has loved me.  I think it is because my mom was never able to love me (his mom is a drug addict)."  Pt shares he has been spending time thinking about what type of person he wants for his future partner.  Pt continues to feel that he is a good dad to [place].  Pt shares that work is still going fine; "nothing to report there."  Encouraged pt to continue to engage with his self care activities and we will meet again in 10 wks for a follow up session.    Interventions: Cognitive Behavioral Therapy  Diagnosis:Adjustment disorder with depressed mood  Plan: Treatment Plan Strengths/Abilities:  Intelligent, Intuitive, Willing to participate in therapy Treatment Preferences:  Outpatient Individual Therapy Statement of Needs:  Patient is to use CBT, mindfulness  and coping skills to help manage and/or decrease symptoms associated with their diagnosis. Symptoms:  Depressed/Irritable mood, worry, social withdrawal Problems Addressed:  Depressive thoughts, Sadness, Sleep issues, etc. Long Term Goals:  Pt to reduce overall level, frequency, and intensity of the feelings of depression as evidenced by decreased irritability, negative self talk, and helpless feelings from 6 to 7 days/week to 0 to 1 days/week, per client report, for at least 3 consecutive months.  Progress: 30% Short Term Goals:  Pt to verbally express understanding of the relationship between feelings of depression and their impact on thinking patterns and behaviors.  Pt to verbalize an  understanding of the role that distorted thinking plays in creating fears, excessive worry, and ruminations.  Progress: 30% Target Date:  09/28/2023 Frequency:  Monthly Modality:  Cognitive Behavioral Therapy Interventions by Therapist:  Therapist will use CBT, Mindfulness exercises, Coping skills and Referrals, as needed by client. Client has verbally approved this treatment plan.  Ivan Anchors, [facility]

## 2023-01-30 ENCOUNTER — Ambulatory Visit (INDEPENDENT_AMBULATORY_CARE_PROVIDER_SITE_OTHER): Payer: 59 | Admitting: Psychology

## 2023-01-30 DIAGNOSIS — F4321 Adjustment disorder with depressed mood: Secondary | ICD-10-CM | POA: Diagnosis not present

## 2023-01-30 NOTE — Progress Notes (Signed)
[facility] Counselor/Therapist Progress Note  Patient ID: Aaron Pugh, MRN: [id number],    Date: 01/30/2023  Time Spent: 60 mins  Treatment Type: Individual Therapy  Reported Symptoms: Pt presents for today's session, via Caregility video.  Pt grants consent for today's session, stating he is in his apt with no one else present.  I shared with pt that I am in my office at home with no one else present here.    Mental Status Exam: Appearance:  Casual     Behavior: Appropriate and Sharing  Motor: Normal  Speech/Language:  Clear and Coherent  Affect: Congruent  Mood: normal  Thought process: normal  Thought content:   WNL  Sensory/Perceptual disturbances:   WNL  Orientation: oriented to person, place, and time/date  Attention: Good  Concentration: Good  Memory: WNL  Fund of knowledge:  Good  Insight:   Good  Judgment:  Good  Impulse Control: Good   Risk Assessment: Danger to Self:  No Self-injurious Behavior: No Danger to Others: No Duty to Warn:no Physical Aggression / Violence:No  Access to Firearms a concern: No  Gang Involvement:No   Subjective: Pt shares that he has been "doing quite well recently (been 2 months since our last session).  I have not had very much drama in my life recently.  I have been kind of laying low.  I have been spending time with the boys and that has been good."  Pt shares that he has been wondering if he has done right by the boys by being divorced  from [place].  He hopes to buy a home in 2024 and wants that to be a good choice for himself.  He shares that his friend group has shrunk a little bit, "and most of that is on me."  Pt shares he is hanging out with [name] and she is the only woman he is seeing; he claims that he does not view it as a relationship.  Pt went to a music festival in [place] with his friend, Tobi Bastos, last weekend and [name] was there too, although they were not together.  Pt shares that he and [name] are still  planning to take [name] and [name] to [place] in June for 7 days.  Pt continues to go to the gym frequently and enjoys that activity.  Pt shares he got together with a HS friend Richardson Dopp) last weekend in [place] and enjoyed that time together.  Pt shares that work is still going fine.  Encouraged pt to continue to engage with his self care activities and we will meet again in 10 wks for a follow up session.    Interventions: Cognitive Behavioral Therapy  Diagnosis:Adjustment disorder with depressed mood  Plan: Treatment Plan Strengths/Abilities:  Intelligent, Intuitive, Willing to participate in therapy Treatment Preferences:  Outpatient Individual Therapy Statement of Needs:  Patient is to use CBT, mindfulness and coping skills to help manage and/or decrease symptoms associated with their diagnosis. Symptoms:  Depressed/Irritable mood, worry, social withdrawal Problems Addressed:  Depressive thoughts, Sadness, Sleep issues, etc. Long Term Goals:  Pt to reduce overall level, frequency, and intensity of the feelings of depression as evidenced by decreased irritability, negative self talk, and helpless feelings from 6 to 7 days/week to 0 to 1 days/week, per client report, for at least 3 consecutive months.  Progress: 30% Short Term Goals:  Pt to verbally express understanding of the relationship between feelings of depression and their impact on thinking patterns and behaviors.  Pt to verbalize  an understanding of the role that distorted thinking plays in creating fears, excessive worry, and ruminations.  Progress: 30% Target Date:  09/28/2023 Frequency:  Monthly Modality:  Cognitive Behavioral Therapy Interventions by Therapist:  Therapist will use CBT, Mindfulness exercises, Coping skills and Referrals, as needed by client. Client has verbally approved this treatment plan.  Karie Kirks, [facility]

## 2023-04-10 ENCOUNTER — Ambulatory Visit (INDEPENDENT_AMBULATORY_CARE_PROVIDER_SITE_OTHER): Payer: 59 | Admitting: Psychology

## 2023-04-10 DIAGNOSIS — F4321 Adjustment disorder with depressed mood: Secondary | ICD-10-CM | POA: Diagnosis not present

## 2023-04-10 NOTE — Progress Notes (Signed)
[facility] Counselor/Therapist Progress Note  Patient ID: Aaron Pugh, MRN: [id number],    Date: 04/10/2023  Time Spent: 60 mins   start time: 1000   end time: 1100  Treatment Type: Individual Therapy  Reported Symptoms: Pt presents for today's session, via Caregility video.  Pt grants consent for today's session, stating he is in his apt with no one else present; pt understands the limits of virtual sessions.  I shared with pt that I am in my office at home with no one else present here.    Mental Status Exam: Appearance:  Casual     Behavior: Appropriate and Sharing  Motor: Normal  Speech/Language:  Clear and Coherent  Affect: Congruent  Mood: normal  Thought process: normal  Thought content:   WNL  Sensory/Perceptual disturbances:   WNL  Orientation: oriented to person, place, and time/date  Attention: Good  Concentration: Good  Memory: WNL  Fund of knowledge:  Good  Insight:   Good  Judgment:  Good  Impulse Control: Good   Risk Assessment: Danger to Self:  No Self-injurious Behavior: No Danger to Others: No Duty to Warn:no Physical Aggression / Violence:No  Access to Firearms a concern: No  Gang Involvement:No   Subjective: Pt shares that he has been "doing pretty well since our last session (2 months ago); things have been pretty steady lately and that is good.  I think Aaron Pugh will start dating pretty soon and I am not sure how I feel about that."  Pt shares that they had a great trip to [place] with the kids in June; pt had fun and so did Aaron Pugh and the boys Aaron Pugh and Aaron Pugh).  Pt shares that he has gained weight recently because he has not been working out as much recently.  He has been off of dating apps since our last session and he has been seeing mainly one woman (Aaron Pugh); he has had sex with other women twice since they have been seeing each other.  Pt shares that he has been purposeful in his slowing down the number of interactions that he is having  with women.  Pt shares that Aaron Pugh has slept at his apt "probably 170 nights of this year so far and I don't really mind."  Pt shares he has no interest in a relationship with Aaron Pugh at this time.  Pt shares portions of their trip to [place] and they did a good job of getting along and co-parented the boys well.  Pt shares that Aaron Pugh told him during their drive back from [facility] that she thinks he may be gay but just has not realized it yet; pt shares that he disagreed with her assessment.  Pt shares that work is going well; he is caught up on his projects.  Encouraged pt to continue to engage with his self care activities and we will meet again in 3 months for a follow up session.    Interventions: Cognitive Behavioral Therapy  Diagnosis:Adjustment disorder with depressed mood  Plan: Treatment Plan Strengths/Abilities:  Intelligent, Intuitive, Willing to participate in therapy Treatment Preferences:  Outpatient Individual Therapy Statement of Needs:  Patient is to use CBT, mindfulness and coping skills to help manage and/or decrease symptoms associated with their diagnosis. Symptoms:  Depressed/Irritable mood, worry, social withdrawal Problems Addressed:  Depressive thoughts, Sadness, Sleep issues, etc. Long Term Goals:  Pt to reduce overall level, frequency, and intensity of the feelings of depression as evidenced by decreased irritability, negative self talk, and helpless feelings  from 6 to 7 days/week to 0 to 1 days/week, per client report, for at least 3 consecutive months.  Progress: 30% Short Term Goals:  Pt to verbally express understanding of the relationship between feelings of depression and their impact on thinking patterns and behaviors.  Pt to verbalize an understanding of the role that distorted thinking plays in creating fears, excessive worry, and ruminations.  Progress: 30% Target Date:  09/28/2023 Frequency:  Monthly Modality:  Cognitive Behavioral Therapy Interventions by Therapist:   Therapist will use CBT, Mindfulness exercises, Coping skills and Referrals, as needed by client. Client has verbally approved this treatment plan.  Karie Kirks, [facility]

## 2023-07-03 ENCOUNTER — Ambulatory Visit (INDEPENDENT_AMBULATORY_CARE_PROVIDER_SITE_OTHER): Payer: 59 | Admitting: Psychology

## 2023-07-03 DIAGNOSIS — F4321 Adjustment disorder with depressed mood: Secondary | ICD-10-CM

## 2023-07-03 NOTE — Progress Notes (Signed)
[facility] Counselor/Therapist Progress Note  Patient ID: Aaron Pugh, MRN: [id number],    Date: 07/03/2023  Time Spent: 60 mins   start time: 1000   end time: 1100  Treatment Type: Individual Therapy  Reported Symptoms: Pt presents for today's session, via Care[employer] video.  Pt grants consent for today's session, stating he is in his apt with no one else present; pt understands the limits of virtual sessions.  I shared with pt that I am in my office at home with no one else present here.    Mental Status Exam: Appearance:  Casual     Behavior: Appropriate and Sharing  Motor: Normal  Speech/Language:  Clear and Coherent  Affect: Congruent  Mood: normal  Thought process: normal  Thought content:   WNL  Sensory/Perceptual disturbances:   WNL  Orientation: oriented to person, place, and time/date  Attention: Good  Concentration: Good  Memory: WNL  Fund of knowledge:  Good  Insight:   Good  Judgment:  Good  Impulse Control: Good   Risk Assessment: Danger to Self:  No Self-injurious Behavior: No Danger to Others: No Duty to Warn:no Physical Aggression / Violence:No  Access to Firearms a concern: No  Gang Involvement:No   Subjective: Pt shares that he has been "doing pretty well since our last session (3 months ago); things have been pretty steady lately and that is good.  I have been doing basically the same thing.  I am still seeing [name] and we sleep together most nights.  She seems to want sex and closeness more than anything else."  Pt describes he and [name] as being in a relationship; "I tell people that she is my girlfriend."  [name] has not met the boys nor has she asked to meet the kids.  [name] is 17 yo, has never been married, and has no kids.  Her parents have each had multiple divorces so she says she is not interested in getting married.  Pt shares that he has recently had sex with [name] when they took the boys to the beach this summer.  Pt shares  that it did not mean anything to him and there has been no fall out from the interaction for either of them.  He shares that the boys [name] and [name]) are still the priority for him.  He states that he has still not been on any dating apps since our last session.  Pt shares that he does not believe that [name] is dating anyone; since he is not on dating apps now, he is not sure if she is on any of them at this time.  Pt feels good financially; he has his half of the value of their house in the bank now; he still does not feel comfortable buying a home right now, mostly because of the interest rates but also some issues with [name] and their future.  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  Pt shares that he is working out more frequently of late; he is training for an upcoming competition.  Encouraged pt to continue to engage with his self care activities and we will meet again in 3 months for a follow up session.    Interventions: Cognitive Behavioral Therapy  Diagnosis:Adjustment disorder with depressed mood  Plan: Treatment Plan Strengths/Abilities:  Intelligent, Intuitive, Willing to participate in therapy Treatment Preferences:  Outpatient Individual Therapy  Statement of Needs:  Patient is to use CBT, mindfulness and coping skills to help manage and/or decrease symptoms associated with their diagnosis. Symptoms:  Depressed/Irritable mood, worry, social withdrawal Problems Addressed:  Depressive thoughts, Sadness, Sleep issues, etc. Long Term Goals:  Pt to reduce overall level, frequency, and intensity of the feelings of depression as evidenced by decreased irritability, negative self talk, and helpless feelings from 6 to 7 days/week to 0 to 1 days/week, per client report, for at least 3 consecutive months.  Progress: 30% Short Term Goals:  Pt to verbally express understanding of the relationship between feelings  of depression and their impact on thinking patterns and behaviors.  Pt to verbalize an understanding of the role that distorted thinking plays in creating fears, excessive worry, and ruminations.  Progress: 30% Target Date:  09/28/2023 Frequency:  Monthly Modality:  Cognitive Behavioral Therapy Interventions by Therapist:  Therapist will use CBT, Mindfulness exercises, Coping skills and Referrals, as needed by client. Client has verbally approved this treatment plan.  Karie Kirks, [facility]

## 2023-09-18 ENCOUNTER — Encounter: Payer: Self-pay | Admitting: Family Medicine

## 2023-09-18 ENCOUNTER — Ambulatory Visit (INDEPENDENT_AMBULATORY_CARE_PROVIDER_SITE_OTHER): Payer: Managed Care, Other (non HMO) | Admitting: Family Medicine

## 2023-09-18 VITALS — BP 116/72 | HR 80 | Temp 97.8°F | Ht 73.25 in | Wt 235.4 lb

## 2023-09-18 DIAGNOSIS — Z113 Encounter for screening for infections with a predominantly sexual mode of transmission: Secondary | ICD-10-CM

## 2023-09-18 DIAGNOSIS — Z13220 Encounter for screening for lipoid disorders: Secondary | ICD-10-CM

## 2023-09-18 DIAGNOSIS — Z131 Encounter for screening for diabetes mellitus: Secondary | ICD-10-CM | POA: Diagnosis not present

## 2023-09-18 DIAGNOSIS — Z1159 Encounter for screening for other viral diseases: Secondary | ICD-10-CM

## 2023-09-18 DIAGNOSIS — A630 Anogenital (venereal) warts: Secondary | ICD-10-CM | POA: Insufficient documentation

## 2023-09-18 DIAGNOSIS — Z0000 Encounter for general adult medical examination without abnormal findings: Secondary | ICD-10-CM | POA: Diagnosis not present

## 2023-09-18 DIAGNOSIS — Z136 Encounter for screening for cardiovascular disorders: Secondary | ICD-10-CM

## 2023-09-18 DIAGNOSIS — R5382 Chronic fatigue, unspecified: Secondary | ICD-10-CM | POA: Insufficient documentation

## 2023-09-18 LAB — BASIC METABOLIC PANEL
BUN: 16 mg/dL (ref 6–23)
CO2: 30 meq/L (ref 19–32)
Calcium: 9.2 mg/dL (ref 8.4–10.5)
Chloride: 101 meq/L (ref 96–112)
Creatinine, Ser: 1.28 mg/dL (ref 0.40–1.50)
GFR: 71.58 mL/min (ref >60.00)
Glucose, Bld: 93 mg/dL (ref 70–99)
Potassium: 4.4 meq/L (ref 3.5–5.1)
Sodium: 137 meq/L (ref 135–145)

## 2023-09-18 LAB — CBC WITH DIFFERENTIAL/PLATELET
Basophils Absolute: 0 10*3/uL (ref 0.0–0.1)
Basophils Relative: 0.7 % (ref 0.0–3.0)
Eosinophils Absolute: 0.2 10*3/uL (ref 0.0–0.7)
Eosinophils Relative: 3.6 % (ref 0.0–5.0)
HCT: 48.8 % (ref 39.0–52.0)
Hemoglobin: 16.7 g/dL (ref 13.0–17.0)
Lymphocytes Relative: 33.8 % (ref 12.0–46.0)
Lymphs Abs: 1.9 10*3/uL (ref 0.7–4.0)
MCHC: 34.1 g/dL (ref 30.0–36.0)
MCV: 89.3 fL (ref 78.0–100.0)
Monocytes Absolute: 0.4 10*3/uL (ref 0.1–1.0)
Monocytes Relative: 7.9 % (ref 3.0–12.0)
Neutro Abs: 3 10*3/uL (ref 1.4–7.7)
Neutrophils Relative %: 54 % (ref 43.0–77.0)
Platelets: 261 10*3/uL (ref 150.0–400.0)
RBC: 5.46 Mil/uL (ref 4.22–5.81)
RDW: 12.9 % (ref 11.5–15.5)
WBC: 5.5 10*3/uL (ref 4.0–10.5)

## 2023-09-18 LAB — LIPID PANEL
Cholesterol: 187 mg/dL (ref 0–200)
HDL: 45.9 mg/dL (ref >39.00)
LDL Cholesterol: 122 mg/dL — ABNORMAL HIGH (ref 0–99)
NonHDL: 141.44
Total CHOL/HDL Ratio: 4
Triglycerides: 98 mg/dL (ref 0.0–149.0)
VLDL: 19.6 mg/dL (ref 0.0–40.0)

## 2023-09-18 LAB — TSH: TSH: 1.28 u[IU]/mL (ref 0.35–5.50)

## 2023-09-18 LAB — VITAMIN B12: Vitamin B-12: 372 pg/mL (ref 211–911)

## 2023-09-18 MED ORDER — IMIQUIMOD 5 % EX CREA: TOPICAL_CREAM | CUTANEOUS | 1 refills | Status: AC

## 2023-09-18 MED ORDER — MELATONIN 5 MG PO CAPS: 1.0000 | ORAL_CAPSULE | Freq: Every evening | ORAL | Status: AC

## 2023-09-18 MED ORDER — B COMPLEX VITAMINS PO CAPS: 1.0000 | ORAL_CAPSULE | Freq: Every day | ORAL | Status: AC

## 2023-09-18 NOTE — Patient Instructions (Addendum)
Bedtime routine checklist: 1. Avoid naps during the day 2. Avoid stimulants such as caffeine and nicotine. Avoid bedtime alcohol (it can speed onset of sleep but the body's metabolism can cause awakenings). 3. All forms of exercise help ensure sound sleep - limit vigorous exercise to morning or late afternoon 4. Avoid food too close to bedtime including chocolate (which contains caffeine) 5. Soak up natural light 6. Establish regular bedtime routine. 7. Associate bed with sleep - avoid TV, computer or phone, reading while in bed. 8. Ensure pleasant, relaxing sleep environment - quiet, dark, cool room.   Labs today  Cut down on caffeine intake.  Call to schedule dentist appointment Good to see you today  Return as needed or in 1-2 years for next physical

## 2023-09-18 NOTE — Progress Notes (Addendum)
Ph: [phone] Fax: [phone]   Patient ID: Aaron Pugh, male    DOB: 1985-12-27, 37 y.o.   MRN: [id number]  This visit was conducted in person.  BP 116/72   Pulse 80   Temp 97.8 F (36.6 C) (Oral)   Ht 6' 1.25" (1.861 m)   Wt 235 lb 6 oz (106.8 kg)   SpO2 99%   BMI 30.84 kg/m    CC: CPE Subjective:   HPI: Aaron Pugh is a 37 y.o. male presenting on 09/18/2023 for Annual Exam   Caffeine: 300-600mg /day including what's in pre-workout.   Divorced 2022.  Notes increased fatigue, lethargy over the past year.  Works out 3-4 days a week.  No longer running.  Not healthiest diet - stress eating.   Some trouble falling asleep.  Melatonin 5mg  has helped.  Mostly remote work.  Continues seeing therapist Maggie Font.   Preventative: Flu shot - declined Tetanus - 2006. Tdap 2016 Seat belt use discussed  Sunscreen use discussed. No changing moles on skin.  Sleep - averaging 6-7 hours/night Non smoker  Alcohol - rare Dentist due - will call to schedule  Eye exam yearly Sexually active - multiple partners, more recently monogamous  Dx genital warts and HSV1   Caffeine: 300-600mg /day Divorced from wife, has 2 sons (2016, 2020) Occupation: [employer]  Edu: BS Engineering  Activity: works out 3-4d/wk - crossfit.  Diet: good water, fruits/vegetables daily, high carb intake      Relevant past medical, surgical, family and social history reviewed and updated as indicated. Interim medical history since our last visit reviewed. Allergies and medications reviewed and updated. Outpatient Medications Prior to Visit  Medication Sig Dispense Refill   KETOCONAZOLE, TOPICAL, 1 % SHAM Apply topically as directed to skin weekly then monthly PRN tinea versicolor 200 mL 0   MULTIPLE VITAMIN PO Take 2 each by mouth daily. Gummies     vitamin C (ASCORBIC ACID) 500 MG tablet Take 500 mg by mouth daily. emergenC     No facility-administered medications prior to  visit.     Per HPI unless specifically indicated in ROS section below Review of Systems  Constitutional:  Negative for activity change, appetite change, chills, fatigue, fever and unexpected weight change.  HENT:  Negative for hearing loss.   Eyes:  Negative for visual disturbance.  Respiratory:  Negative for cough, chest tightness, shortness of breath and wheezing.   Cardiovascular:  Negative for chest pain, palpitations and leg swelling.  Gastrointestinal:  Negative for abdominal distention, abdominal pain, blood in stool, constipation, diarrhea, nausea and vomiting.  Genitourinary:  Negative for difficulty urinating and hematuria.  Musculoskeletal:  Negative for arthralgias, myalgias and neck pain.  Skin:  Negative for rash.  Neurological:  Negative for dizziness, seizures, syncope and headaches.  Hematological:  Negative for adenopathy. Does not bruise/bleed easily.  Psychiatric/Behavioral:  Negative for dysphoric mood. The patient is not nervous/anxious.     Objective:  BP 116/72   Pulse 80   Temp 97.8 F (36.6 C) (Oral)   Ht 6' 1.25" (1.861 m)   Wt 235 lb 6 oz (106.8 kg)   SpO2 99%   BMI 30.84 kg/m   Wt Readings from Last 3 Encounters:  09/18/23 235 lb 6 oz (106.8 kg)  11/27/20 212 lb 8 oz (96.4 kg)  09/25/20 205 lb (93 kg)      Physical Exam Vitals and nursing note reviewed.  Constitutional:      General: He is  not in acute distress.    Appearance: Normal appearance. He is well-developed. He is not ill-appearing.  HENT:     Head: Normocephalic and atraumatic.     Right Ear: Hearing, tympanic membrane, ear canal and external ear normal.     Left Ear: Hearing, tympanic membrane, ear canal and external ear normal.     Mouth/Throat:     Mouth: Mucous membranes are moist.     Pharynx: Oropharynx is clear. No oropharyngeal exudate or posterior oropharyngeal erythema.  Eyes:     General: No scleral icterus.    Extraocular Movements: Extraocular movements intact.      Conjunctiva/sclera: Conjunctivae normal.     Pupils: Pupils are equal, round, and reactive to light.  Neck:     Thyroid: No thyroid mass or thyromegaly.  Cardiovascular:     Rate and Rhythm: Normal rate and regular rhythm.     Pulses: Normal pulses.          Radial pulses are 2+ on the right side and 2+ on the left side.     Heart sounds: Normal heart sounds. No murmur heard. Pulmonary:     Effort: Pulmonary effort is normal. No respiratory distress.     Breath sounds: Normal breath sounds. No wheezing, rhonchi or rales.  Abdominal:     General: Bowel sounds are normal. There is no distension.     Palpations: Abdomen is soft. There is no mass.     Tenderness: There is no abdominal tenderness. There is no guarding or rebound.     Hernia: No hernia is present.  Musculoskeletal:        General: Normal range of motion.     Cervical back: Normal range of motion and neck supple.     Right lower leg: No edema.     Left lower leg: No edema.  Lymphadenopathy:     Cervical: No cervical adenopathy.  Skin:    General: Skin is warm and dry.     Findings: Lesion present. No rash.     Comments:  Verrucous growths to groin near base of penis on right  Neurological:     General: No focal deficit present.     Mental Status: He is alert and oriented to person, place, and time.  Psychiatric:        Mood and Affect: Mood normal.        Behavior: Behavior normal.        Thought Content: Thought content normal.        Judgment: Judgment normal.        Assessment & Plan:   Problem List Items Addressed This Visit     Health maintenance examination - Primary (Chronic)   Preventative protocols reviewed and updated unless pt declined. Discussed healthy diet and lifestyle.  Encouraged back off caffeine intake.  Reviewed sleep hygiene measures      Genital warts   Exam consistent with this - Rx imiquimod, discussed topical treatment MWF for max 12 wks or until full wart resolution. Reviewed  side effects to monitor for.       Relevant Medications   imiquimod (ALDARA) 5 % cream   Chronic fatigue   Check labs for reversible causes of fatigue.       Relevant Orders   Vitamin B12   CBC with Differential/Platelet   TSH   Other Visit Diagnoses       Diabetes mellitus screening       Relevant Orders   Basic metabolic  panel     Encounter for lipid screening for cardiovascular disease       Relevant Orders   Lipid panel     Need for hepatitis C screening test       Relevant Orders   Hepatitis C antibody     Screen for STD (sexually transmitted disease)       Relevant Orders   HIV Antibody (routine testing w rflx)   RPR   C. trachomatis/N. gonorrhoeae RNA        Meds ordered this encounter  Medications   Melatonin 5 MG CAPS    Sig: Take 1 capsule (5 mg total) by mouth at bedtime.   b complex vitamins capsule    Sig: Take 1 capsule by mouth daily.   imiquimod (ALDARA) 5 % cream    Sig: Apply topically 3 (three) times a week. AAA for max 12 weeks    Dispense:  12 each    Refill:  1    Orders Placed This Encounter  Procedures   C. trachomatis/N. gonorrhoeae RNA   Lipid panel   Basic metabolic panel   Vitamin B12   CBC with Differential/Platelet   TSH   Hepatitis C antibody   HIV Antibody (routine testing w rflx)   RPR    Patient Instructions  Bedtime routine checklist: 1. Avoid naps during the day 2. Avoid stimulants such as caffeine and nicotine. Avoid bedtime alcohol (it can speed onset of sleep but the body's metabolism can cause awakenings). 3. All forms of exercise help ensure sound sleep - limit vigorous exercise to morning or late afternoon 4. Avoid food too close to bedtime including chocolate (which contains caffeine) 5. Soak up natural light 6. Establish regular bedtime routine. 7. Associate bed with sleep - avoid TV, computer or phone, reading while in bed. 8. Ensure pleasant, relaxing sleep environment - quiet, dark, cool room.   Labs  today  Cut down on caffeine intake.  Call to schedule dentist appointment Good to see you today  Return as needed or in 1-2 years for next physical   Follow up plan: Return in about 2 years (around 09/17/2025) for annual exam, prior fasting for blood work.  Eustaquio Boyden, MD

## 2023-09-18 NOTE — Assessment & Plan Note (Signed)
Exam consistent with this - Rx imiquimod, discussed topical treatment MWF for max 12 wks or until full wart resolution. Reviewed side effects to monitor for.

## 2023-09-18 NOTE — Assessment & Plan Note (Addendum)
Preventative protocols reviewed and updated unless pt declined. Discussed healthy diet and lifestyle.  Encouraged back off caffeine intake.  Reviewed sleep hygiene measures

## 2023-09-18 NOTE — Assessment & Plan Note (Signed)
Check labs for reversible causes of fatigue. ?

## 2023-09-19 LAB — C. TRACHOMATIS/N. GONORRHOEAE RNA
C. trachomatis RNA, TMA: NOT DETECTED
N. gonorrhoeae RNA, TMA: NOT DETECTED

## 2023-09-19 LAB — RPR: RPR Ser Ql: NONREACTIVE

## 2023-09-19 LAB — HEPATITIS C ANTIBODY: Hepatitis C Ab: NONREACTIVE

## 2023-09-19 LAB — HIV ANTIBODY (ROUTINE TESTING W REFLEX): HIV 1&2 Ab, 4th Generation: NONREACTIVE

## 2023-10-09 ENCOUNTER — Ambulatory Visit: Payer: 59 | Admitting: Psychology
